# Patient Record
Sex: Female | Born: 1955 | Race: White | Hispanic: No | Marital: Married | Smoking: Never smoker
Health system: Southern US, Community
[De-identification: ages and names within clinical notes are randomized; demographics above are authoritative.]

## PROBLEM LIST (undated history)

## (undated) DIAGNOSIS — I10 Essential (primary) hypertension: Secondary | ICD-10-CM

## (undated) DIAGNOSIS — C801 Malignant (primary) neoplasm, unspecified: Secondary | ICD-10-CM

## (undated) HISTORY — PX: NASAL SINUS SURGERY: SHX719

---

## 2005-06-23 ENCOUNTER — Ambulatory Visit: Payer: Self-pay | Admitting: Otolaryngology

## 2014-11-17 ENCOUNTER — Encounter: Payer: Self-pay | Admitting: Gynecology

## 2014-11-17 ENCOUNTER — Ambulatory Visit
Admission: EM | Admit: 2014-11-17 | Discharge: 2014-11-17 | Disposition: A | Discharge status: Home / Self Care | Payer: 59 | Attending: Family Medicine | Admitting: Family Medicine

## 2014-11-17 DIAGNOSIS — J0100 Acute maxillary sinusitis, unspecified: Secondary | ICD-10-CM | POA: Insufficient documentation

## 2014-11-17 DIAGNOSIS — R0981 Nasal congestion: Secondary | ICD-10-CM | POA: Diagnosis present

## 2014-11-17 DIAGNOSIS — H66001 Acute suppurative otitis media without spontaneous rupture of ear drum, right ear: Secondary | ICD-10-CM | POA: Diagnosis not present

## 2014-11-17 DIAGNOSIS — J40 Bronchitis, not specified as acute or chronic: Secondary | ICD-10-CM | POA: Diagnosis not present

## 2014-11-17 DIAGNOSIS — J029 Acute pharyngitis, unspecified: Secondary | ICD-10-CM

## 2014-11-17 DIAGNOSIS — R05 Cough: Secondary | ICD-10-CM | POA: Diagnosis present

## 2014-11-17 LAB — RAPID STREP SCREEN (MED CTR MEBANE ONLY): Streptococcus, Group A Screen (Direct): NEGATIVE

## 2014-11-17 MED ORDER — FEXOFENADINE-PSEUDOEPHED ER 60-120 MG PO TB12: 1.0000 | ORAL_TABLET | Freq: Two times a day (BID) | ORAL | Status: DC

## 2014-11-17 MED ORDER — CEFUROXIME AXETIL 500 MG PO TABS: 500.0000 mg | ORAL_TABLET | Freq: Two times a day (BID) | ORAL | Status: DC

## 2014-11-17 MED ORDER — BENZONATATE 200 MG PO CAPS: 200.0000 mg | ORAL_CAPSULE | Freq: Three times a day (TID) | ORAL | Status: DC | PRN

## 2014-11-17 NOTE — Discharge Instructions (Signed)
Cough, Adult  A cough is a reflex that helps clear your throat and airways. It can help heal the body or may be a reaction to an irritated airway. A cough may only last 2 or 3 weeks (acute) or may last more than 8 weeks (chronic).  CAUSES Acute cough:  Viral or bacterial infections. Chronic cough:  Infections.  Allergies.  Asthma.  Post-nasal drip.  Smoking.  Heartburn or acid reflux.  Some medicines.  Chronic lung problems (COPD).  Cancer. SYMPTOMS   Cough.  Fever.  Chest pain.  Increased breathing rate.  High-pitched whistling sound when breathing (wheezing).  Colored mucus that you cough up (sputum). TREATMENT   A bacterial cough may be treated with antibiotic medicine.  A viral cough must run its course and will not respond to antibiotics.  Your caregiver may recommend other treatments if you have a chronic cough. HOME CARE INSTRUCTIONS   Only take over-the-counter or prescription medicines for pain, discomfort, or fever as directed by your caregiver. Use cough suppressants only as directed by your caregiver.  Use a cold steam vaporizer or humidifier in your bedroom or home to help loosen secretions.  Sleep in a semi-upright position if your cough is worse at night.  Rest as needed.  Stop smoking if you smoke. SEEK IMMEDIATE MEDICAL CARE IF:   You have pus in your sputum.  Your cough starts to worsen.  You cannot control your cough with suppressants and are losing sleep.  You begin coughing up blood.  You have difficulty breathing.  You develop pain which is getting worse or is uncontrolled with medicine.  You have a fever. MAKE SURE YOU:   Understand these instructions.  Will watch your condition.  Will get help right away if you are not doing well or get worse. Document Released: 12/18/2010 Document Revised: 09/13/2011 Document Reviewed: 12/18/2010 [facility] Patient Information 2015 [place], [place]. This information is not intended  to replace advice given to you by your health care provider. Make sure you discuss any questions you have with your health care provider. Otitis Media Otitis media is redness, soreness, and puffiness (swelling) in the space just behind your eardrum (middle ear). It may be caused by allergies or infection. It often happens along with a cold. HOME CARE  Take your medicine as told. Finish it even if you start to feel better.  Only take over-the-counter or prescription medicines for pain, discomfort, or fever as told by your doctor.  Follow up with your doctor as told. GET HELP IF:  You have otitis media only in one ear, or bleeding from your nose, or both.  You notice a lump on your neck.  You are not getting better in 3-5 days.  You feel worse instead of better. GET HELP RIGHT AWAY IF:   You have pain that is not helped with medicine.  You have puffiness, redness, or pain around your ear.  You get a stiff neck.  You cannot move part of your face (paralysis).  You notice that the bone behind your ear hurts when you touch it. MAKE SURE YOU:   Understand these instructions.  Will watch your condition.  Will get help right away if you are not doing well or get worse. Document Released: 12/08/2007 Document Revised: 06/26/2013 Document Reviewed: 01/16/2013 [facility] Patient Information 2015 [place], [place]. This information is not intended to replace advice given to you by your health care provider. Make sure you discuss any questions you have with your health care  provider. Sinusitis Sinusitis is redness, soreness, and inflammation of the paranasal sinuses. Paranasal sinuses are air pockets within the bones of your face (beneath the eyes, the middle of the forehead, or above the eyes). In healthy paranasal sinuses, mucus is able to drain out, and air is able to circulate through them by way of your nose. However, when your paranasal sinuses are inflamed, mucus and air can become  trapped. This can allow bacteria and other germs to grow and cause infection. Sinusitis can develop quickly and last only a short time (acute) or continue over a long period (chronic). Sinusitis that lasts for more than 12 weeks is considered chronic.  CAUSES  Causes of sinusitis include:  Allergies.  Structural abnormalities, such as displacement of the cartilage that separates your nostrils (deviated septum), which can decrease the air flow through your nose and sinuses and affect sinus drainage.  Functional abnormalities, such as when the small hairs (cilia) that line your sinuses and help remove mucus do not work properly or are not present. SIGNS AND SYMPTOMS  Symptoms of acute and chronic sinusitis are the same. The primary symptoms are pain and pressure around the affected sinuses. Other symptoms include:  Upper toothache.  Earache.  Headache.  Bad breath.  Decreased sense of smell and taste.  A cough, which worsens when you are lying flat.  Fatigue.  Fever.  Thick drainage from your nose, which often is green and may contain pus (purulent).  Swelling and warmth over the affected sinuses. DIAGNOSIS  Your health care provider will perform a physical exam. During the exam, your health care provider may:  Look in your nose for signs of abnormal growths in your nostrils (nasal polyps).  Tap over the affected sinus to check for signs of infection.  View the inside of your sinuses (endoscopy) using an imaging device that has a light attached (endoscope). If your health care provider suspects that you have chronic sinusitis, one or more of the following tests may be recommended:  Allergy tests.  Nasal culture. A sample of mucus is taken from your nose, sent to a lab, and screened for bacteria.  Nasal cytology. A sample of mucus is taken from your nose and examined by your health care provider to determine if your sinusitis is related to an allergy. TREATMENT  Most  cases of acute sinusitis are related to a viral infection and will resolve on their own within 10 days. Sometimes medicines are prescribed to help relieve symptoms (pain medicine, decongestants, nasal steroid sprays, or saline sprays).  However, for sinusitis related to a bacterial infection, your health care provider will prescribe antibiotic medicines. These are medicines that will help kill the bacteria causing the infection.  Rarely, sinusitis is caused by a fungal infection. In theses cases, your health care provider will prescribe antifungal medicine. For some cases of chronic sinusitis, surgery is needed. Generally, these are cases in which sinusitis recurs more than 3 times per year, despite other treatments. HOME CARE INSTRUCTIONS   Drink plenty of water. Water helps thin the mucus so your sinuses can drain more easily.  Use a humidifier.  Inhale steam 3 to 4 times a day (for example, sit in the bathroom with the shower running).  Apply a warm, moist washcloth to your face 3 to 4 times a day, or as directed by your health care provider.  Use saline nasal sprays to help moisten and clean your sinuses.  Take medicines only as directed by your health care  provider.  If you were prescribed either an antibiotic or antifungal medicine, finish it all even if you start to feel better. SEEK IMMEDIATE MEDICAL CARE IF:  You have increasing pain or severe headaches.  You have nausea, vomiting, or drowsiness.  You have swelling around your face.  You have vision problems.  You have a stiff neck.  You have difficulty breathing. MAKE SURE YOU:   Understand these instructions.  Will watch your condition.  Will get help right away if you are not doing well or get worse. Document Released: 06/21/2005 Document Revised: 11/05/2013 Document Reviewed: 07/06/2011 [facility] Patient Information 2015 [place], [place]. This information is not intended to replace advice given to you by your health  care provider. Make sure you discuss any questions you have with your health care provider.

## 2014-11-17 NOTE — ED Notes (Signed)
Patient c/o severe cough / congestion / sore throat/ thick greenish mucous x 5 days.

## 2014-11-17 NOTE — ED Provider Notes (Signed)
CSN: [id number]     Arrival date & time 11/17/14  [id number] History   First MD Initiated Contact with Patient 11/17/14 [id number]     Chief Complaint  Patient presents with  . Cough  . Sore Throat  . Nasal Congestion   (Consider location/radiation/quality/duration/timing/severity/associated sxs/prior Treatment) Patient is a 59 y.o. female presenting with cough and pharyngitis. The history is provided by the patient and the spouse. No language interpreter was used.  Cough Cough characteristics:  Productive, paroxysmal, nocturnal and hacking Sputum characteristics:  [place] (She states that this is coming from nasal drainage) Severity:  Moderate Onset quality: Started 5 days ago last Wednesday. Progression:  Worsening Chronicity:  New Smoker: no   Context: upper respiratory infection   Context: not animal exposure, not exposure to allergens, not fumes, not occupational exposure, not sick contacts, not smoke exposure, not weather changes and not with activity   Relieved by:  Nothing Worsened by:  Lying down Ineffective treatments:  Rest and cough suppressants Associated symptoms: sinus congestion and sore throat   Associated symptoms: no chest pain, no chills, no diaphoresis and no weight loss   Risk factors: no chemical exposure, no recent infection and no recent travel   Sore Throat Pertinent negatives include no chest pain.    History reviewed. No pertinent past medical history. Past Surgical History  Procedure Laterality Date  . Nasal sinus surgery     History reviewed. No pertinent family history. History  Substance Use Topics  . Smoking status: Never Smoker   . Smokeless tobacco: Not on file  . Alcohol Use: No   OB History    No data available     Review of Systems  Constitutional: Negative for chills, weight loss and diaphoresis.  HENT: Positive for sore throat.   Respiratory: Positive for cough.   Cardiovascular: Negative for chest pain.  All other systems reviewed and are  negative.   Allergies  Codeine and Sulfa antibiotics  Home Medications   Prior to Admission medications   Medication Sig Start Date End Date Taking? Authorizing Provider  benzonatate (TESSALON) 200 MG capsule Take 1 capsule (200 mg total) by mouth 3 (three) times daily as needed for cough. 11/17/14   Hassan RowanEugene Sheba Whaling, MD  cefUROXime (CEFTIN) 500 MG tablet Take 1 tablet (500 mg total) by mouth 2 (two) times daily. 11/17/14   Hassan RowanEugene Rether Rison, MD  fexofenadine-pseudoephedrine (ALLEGRA-D) 60-120 MG per tablet Take 1 tablet by mouth 2 (two) times daily. 11/17/14   Hassan RowanEugene Joyce Leckey, MD   BP 123/85 mmHg  Pulse 109  Temp(Src) 98.3 F (36.8 C) (Tympanic)  Ht 5\' 4"  (1.626 m)  Wt 162 lb (73.483 kg)  BMI 27.79 kg/m2  SpO2 100% Physical Exam  Constitutional: She is oriented to person, place, and time. She appears well-developed and well-nourished.  HENT:  Head: Normocephalic.  Right Ear: Tympanic membrane is erythematous and bulging.  Left Ear: Tympanic membrane is not erythematous and not bulging.  Nose: Mucosal edema and rhinorrhea present. Right sinus exhibits maxillary sinus tenderness. Left sinus exhibits maxillary sinus tenderness.  Mouth/Throat: Mucous membranes are normal. She does not have dentures. No oral lesions. No dental abscesses or dental caries. Posterior oropharyngeal erythema present.  Eyes: Pupils are equal, round, and reactive to light.  Neck: Neck supple. No tracheal deviation present.  Cardiovascular: Normal rate.   Pulmonary/Chest: Effort normal and breath sounds normal. No respiratory distress. She has no wheezes.  Musculoskeletal: Normal range of motion. She exhibits no edema.  Lymphadenopathy:  She has cervical adenopathy.  Neurological: She is alert and oriented to person, place, and time. No cranial nerve deficit.  Skin: Skin is warm.  Psychiatric: She has a normal mood and affect.    ED Course  Procedures (including critical care time) Labs Review Labs Reviewed  RAPID  STREP SCREEN  CULTURE, GROUP A STREP [facility])    Imaging Review No results found.   MDM   1. Acute maxillary sinusitis, recurrence not specified   2. Pharyngitis   3. Bronchitis   4. Acute suppurative otitis media of right ear without spontaneous rupture of tympanic membrane, recurrence not specified        Hassan RowanEugene Elizebath Wever, MD 11/17/14 1430

## 2014-11-19 LAB — CULTURE, GROUP A STREP (THRC)

## 2016-08-22 ENCOUNTER — Ambulatory Visit
Admission: EM | Admit: 2016-08-22 | Discharge: 2016-08-22 | Disposition: A | Discharge status: Home / Self Care | Payer: 59 | Attending: Family Medicine | Admitting: Family Medicine

## 2016-08-22 DIAGNOSIS — R42 Dizziness and giddiness: Secondary | ICD-10-CM | POA: Diagnosis not present

## 2016-08-22 DIAGNOSIS — R110 Nausea: Secondary | ICD-10-CM | POA: Diagnosis present

## 2016-08-22 DIAGNOSIS — A084 Viral intestinal infection, unspecified: Secondary | ICD-10-CM | POA: Insufficient documentation

## 2016-08-22 DIAGNOSIS — Z79899 Other long term (current) drug therapy: Secondary | ICD-10-CM | POA: Insufficient documentation

## 2016-08-22 LAB — CBC WITH DIFFERENTIAL/PLATELET
BASOS PCT: 1 %
Basophils Absolute: 0 10*3/uL (ref 0–0.1)
Eosinophils Absolute: 0 10*3/uL (ref 0–0.7)
Eosinophils Relative: 0 %
HCT: 43.9 % (ref 35.0–47.0)
Hemoglobin: 14.7 g/dL (ref 12.0–16.0)
LYMPHS PCT: 27 %
Lymphs Abs: 1.6 10*3/uL (ref 1.0–3.6)
MCH: 31.5 pg (ref 26.0–34.0)
MCHC: 33.5 g/dL (ref 32.0–36.0)
MCV: 94.2 fL (ref 80.0–100.0)
MONOS PCT: 7 %
Monocytes Absolute: 0.4 10*3/uL (ref 0.2–0.9)
NEUTROS ABS: 4 10*3/uL (ref 1.4–6.5)
Neutrophils Relative %: 65 %
PLATELETS: 308 10*3/uL (ref 150–440)
RBC: 4.66 MIL/uL (ref 3.80–5.20)
RDW: 12.8 % (ref 11.5–14.5)
WBC: 6.1 10*3/uL (ref 3.6–11.0)

## 2016-08-22 LAB — COMPREHENSIVE METABOLIC PANEL
ALT: 20 U/L (ref 14–54)
ANION GAP: 8 (ref 5–15)
AST: 23 U/L (ref 15–41)
Albumin: 4.6 g/dL (ref 3.5–5.0)
Alkaline Phosphatase: 48 U/L (ref 38–126)
BUN: 9 mg/dL (ref 6–20)
CO2: 26 mmol/L (ref 22–32)
Calcium: 9.2 mg/dL (ref 8.9–10.3)
Chloride: 102 mmol/L (ref 101–111)
Creatinine, Ser: 0.66 mg/dL (ref 0.44–1.00)
GFR calc Af Amer: >60 mL/min (ref >60)
GFR calc non Af Amer: >60 mL/min (ref >60)
Glucose, Bld: 104 mg/dL — ABNORMAL HIGH (ref 65–99)
Potassium: 4.1 mmol/L (ref 3.5–5.1)
Sodium: 136 mmol/L (ref 135–145)
TOTAL PROTEIN: 7.8 g/dL (ref 6.5–8.1)
Total Bilirubin: 0.6 mg/dL (ref 0.3–1.2)

## 2016-08-22 LAB — OCCULT BLOOD X 1 CARD TO LAB, STOOL: Fecal Occult Bld: NEGATIVE

## 2016-08-22 MED ORDER — ONDANSETRON 8 MG PO TBDP: 8.0000 mg | ORAL_TABLET | Freq: Three times a day (TID) | ORAL | 0 refills | Status: DC | PRN

## 2016-08-22 NOTE — ED Triage Notes (Signed)
Onset week was nauseous, diarrhea--- dark stool from past 3 days was dizzy weeks a go when pt wake up and has tingling feeling and has HA ongoing as per pt

## 2016-08-22 NOTE — ED Provider Notes (Addendum)
[facility]    CSN: [id number] Arrival date & time: 08/22/16  [id number]     History   Chief Complaint Chief Complaint  Patient presents with  . Nausea    HPI Wendy Schroeder is a 61 y.o. female.   61 yo female with a c/o nausea and loose dark stools for the past week. Patient also complains of intermittent dizziness for the past 3 days. Also complains of intermittent tingling all over her extremities. Denies any fevers, chills, chest pains, palpitations, shortness of breath. Patient's last visit to PCP was about 3 years ago and has never had a colonoscopy.    The history is provided by the patient.    History reviewed. No pertinent past medical history.  There are no active problems to display for this patient.   Past Surgical History:  Procedure Laterality Date  . NASAL SINUS SURGERY      OB History    No data available       Home Medications    Prior to Admission medications   Medication Sig Start Date End Date Taking? Authorizing Provider  benzonatate (TESSALON) 200 MG capsule Take 1 capsule (200 mg total) by mouth 3 (three) times daily as needed for cough. 11/17/14   Hassan RowanEugene Wade, MD  cefUROXime (CEFTIN) 500 MG tablet Take 1 tablet (500 mg total) by mouth 2 (two) times daily. 11/17/14   Hassan RowanEugene Wade, MD  fexofenadine-pseudoephedrine (ALLEGRA-D) 60-120 MG per tablet Take 1 tablet by mouth 2 (two) times daily. 11/17/14   Hassan RowanEugene Wade, MD  ondansetron (ZOFRAN ODT) 8 MG disintegrating tablet Take 1 tablet (8 mg total) by mouth every 8 (eight) hours as needed. 08/22/16   Payton Mccallumrlando Makynzee Tigges, MD    Family History No family history on file.  Social History Social History  Substance Use Topics  . Smoking status: Never Smoker  . Smokeless tobacco: Never Used  . Alcohol use No     Allergies   Codeine and Sulfa antibiotics   Review of Systems Review of Systems   Physical Exam Triage Vital Signs ED Triage Vitals  Enc Vitals Group     BP 08/22/16 0919 138/87     Pulse Rate 08/22/16 0919 85     Resp 08/22/16 0919 16     Temp 08/22/16 0919 98 F (36.7 C)     Temp Source 08/22/16 0919 Oral     SpO2 08/22/16 0919 100 %     Weight 08/22/16 0917 160 lb (72.6 kg)     Height 08/22/16 0917 5\' 6"  (1.676 m)     Head Circumference --      Peak Flow --      Pain Score 08/22/16 0918 7     Pain Loc --      Pain Edu? --      Excl. in GC? --    No data found.   Updated Vital Signs BP 138/87 (BP Location: Left Arm)   Pulse 85   Temp 98 F (36.7 C) (Oral)   Resp 16   Ht 5\' 6"  (1.676 m)   Wt 160 lb (72.6 kg)   SpO2 100%   BMI 25.82 kg/m   Visual Acuity Right Eye Distance:   Left Eye Distance:   Bilateral Distance:    Right Eye Near:   Left Eye Near:    Bilateral Near:     Physical Exam  Constitutional: She is oriented to person, place, and time. She appears well-developed and well-nourished. No distress.  HENT:  Head: Normocephalic.  Right Ear: Tympanic membrane, external ear and ear canal normal.  Left Ear: Tympanic membrane, external ear and ear canal normal.  Nose: Nose normal.  Mouth/Throat: Oropharynx is clear and moist and mucous membranes are normal. No oropharyngeal exudate.  Eyes: Conjunctivae and EOM are normal. Pupils are equal, round, and reactive to light. Right eye exhibits no discharge. Left eye exhibits no discharge. No scleral icterus.  Neck: Normal range of motion. Neck supple. No JVD present. No tracheal deviation present. No thyromegaly present.  Cardiovascular: Normal rate, regular rhythm, normal heart sounds and intact distal pulses.   No murmur heard. Pulmonary/Chest: Effort normal and breath sounds normal. No stridor. No respiratory distress. She has no wheezes. She has no rales. She exhibits no tenderness.  Abdominal: Soft. Bowel sounds are normal. She exhibits no distension and no mass. There is tenderness (mild, diffuse, left and right lower quadrant). There is no rebound and no guarding.  Musculoskeletal: She  exhibits no edema or tenderness.  Lymphadenopathy:    She has no cervical adenopathy.  Neurological: She is alert and oriented to person, place, and time. She has normal reflexes. No cranial nerve deficit.  Skin: Skin is warm and dry. No rash noted. She is not diaphoretic. No erythema. No pallor.  Psychiatric: She has a normal mood and affect. Her behavior is normal. Judgment and thought content normal.  Nursing note and vitals reviewed.    UC Treatments / Results  Labs (all labs ordered are listed, but only abnormal results are displayed) Labs Reviewed  COMPREHENSIVE METABOLIC PANEL - Abnormal; Notable for the following:       Result Value   Glucose, Bld 104 (*)    All other components within normal limits  CBC WITH DIFFERENTIAL/PLATELET  OCCULT BLOOD X 1 CARD TO LAB, STOOL    EKG  EKG Interpretation None       Radiology No results found.  Procedures ED EKG Date/Time: 08/22/2016 10:54 AM Performed by: Payton Mccallum Authorized by: Payton Mccallum   ECG reviewed by ED Physician in the absence of a cardiologist: yes   Previous ECG:    Previous ECG:  Unavailable Interpretation:    Interpretation: normal   Rate:    ECG rate assessment: normal   Rhythm:    Rhythm: sinus rhythm   Ectopy:    Ectopy: none   QRS:    QRS axis:  Normal Conduction:    Conduction: normal   ST segments:    ST segments:  Normal T waves:    T waves: normal      (including critical care time)  Medications Ordered in UC Medications - No data to display   Initial Impression / Assessment and Plan / UC Course  I have reviewed the triage vital signs and the nursing notes.  Pertinent labs & imaging results that were available during my care of the patient were reviewed by me and considered in my medical decision making (see chart for details).       Final Clinical Impressions(s) / UC Diagnoses   Final diagnoses:  Viral gastroenteritis  Dizziness    New Prescriptions Discharge  Medication List as of 08/22/2016 10:51 AM    START taking these medications   Details  ondansetron (ZOFRAN ODT) 8 MG disintegrating tablet Take 1 tablet (8 mg total) by mouth every 8 (eight) hours as needed., Starting Sun 08/22/2016, Normal       1.Lab results and diagnosis reviewed with patient 2. rx as  per orders above; reviewed possible side effects, interactions, risks and benefits  3. Recommend supportive treatment with increased fluids, otc Imodium AD prn 4. Recommend follow up with PCP  5. Follow-up prn if symptoms worsen or don't improve   Payton Mccallum, MD 08/22/16 1057    Payton Mccallum, MD 08/22/16 1058

## 2016-08-22 NOTE — Discharge Instructions (Signed)
Imodium AD over the counter as needed for loose stools Increase fluids/liquids, bland diet then advance slowly to regular diet

## 2018-12-27 ENCOUNTER — Other Ambulatory Visit: Payer: Self-pay

## 2018-12-27 ENCOUNTER — Ambulatory Visit
Admission: EM | Admit: 2018-12-27 | Discharge: 2018-12-27 | Disposition: A | Discharge status: Home / Self Care | Payer: 59 | Attending: Family Medicine | Admitting: Family Medicine

## 2018-12-27 ENCOUNTER — Encounter: Payer: Self-pay | Admitting: Emergency Medicine

## 2018-12-27 DIAGNOSIS — R202 Paresthesia of skin: Secondary | ICD-10-CM | POA: Diagnosis not present

## 2018-12-27 DIAGNOSIS — R0789 Other chest pain: Secondary | ICD-10-CM | POA: Diagnosis not present

## 2018-12-27 LAB — COMPREHENSIVE METABOLIC PANEL
ALT: 13 U/L (ref 0–44)
AST: 18 U/L (ref 15–41)
Albumin: 4.6 g/dL (ref 3.5–5.0)
Alkaline Phosphatase: 84 U/L (ref 38–126)
Anion gap: 10 (ref 5–15)
BUN: 12 mg/dL (ref 8–23)
CO2: 24 mmol/L (ref 22–32)
Calcium: 9.1 mg/dL (ref 8.9–10.3)
Chloride: 105 mmol/L (ref 98–111)
Creatinine, Ser: 0.68 mg/dL (ref 0.44–1.00)
GFR calc Af Amer: >60 mL/min (ref >60)
GFR calc non Af Amer: >60 mL/min (ref >60)
Glucose, Bld: 110 mg/dL — ABNORMAL HIGH (ref 70–99)
Potassium: 4.1 mmol/L (ref 3.5–5.1)
Sodium: 139 mmol/L (ref 135–145)
Total Bilirubin: 0.7 mg/dL (ref 0.3–1.2)
Total Protein: 7.6 g/dL (ref 6.5–8.1)

## 2018-12-27 LAB — CBC WITH DIFFERENTIAL/PLATELET
Abs Immature Granulocytes: 0.01 10*3/uL (ref 0.00–0.07)
Basophils Absolute: 0 10*3/uL (ref 0.0–0.1)
Basophils Relative: 1 %
Eosinophils Absolute: 0 10*3/uL (ref 0.0–0.5)
Eosinophils Relative: 0 %
HCT: 42.7 % (ref 36.0–46.0)
Hemoglobin: 14.7 g/dL (ref 12.0–15.0)
Immature Granulocytes: 0 %
Lymphocytes Relative: 27 %
Lymphs Abs: 1.5 10*3/uL (ref 0.7–4.0)
MCH: 32.4 pg (ref 26.0–34.0)
MCHC: 34.4 g/dL (ref 30.0–36.0)
MCV: 94.1 fL (ref 80.0–100.0)
Monocytes Absolute: 0.4 10*3/uL (ref 0.1–1.0)
Monocytes Relative: 6 %
Neutro Abs: 3.6 10*3/uL (ref 1.7–7.7)
Neutrophils Relative %: 66 %
Platelets: 273 10*3/uL (ref 150–400)
RBC: 4.54 MIL/uL (ref 3.87–5.11)
RDW: 12.4 % (ref 11.5–15.5)
WBC: 5.5 10*3/uL (ref 4.0–10.5)
nRBC: 0 % (ref 0.0–0.2)

## 2018-12-27 MED ORDER — HYDROXYZINE HCL 25 MG PO TABS: 25.0000 mg | ORAL_TABLET | Freq: Three times a day (TID) | ORAL | 0 refills | Status: AC | PRN

## 2018-12-27 NOTE — ED Triage Notes (Signed)
Patient states for the past several days she has been jittery and felt a little nauseated and like her bp is elevated

## 2018-12-27 NOTE — Discharge Instructions (Signed)
Labs and EKG unremarkable.  Suspect anxiety.  Medication as directed

## 2018-12-27 NOTE — ED Provider Notes (Signed)
[facility]    CSN: [id number] Arrival date & time: 12/27/18  0850  History   Chief Complaint Chief Complaint  Patient presents with  . Hypertension    HPI  63 year old female presents with complaints of skin tingling, nausea, and chest pressure.  Patient reports for the past 4 to 5 days she has had the above symptoms.  She reports that her skin feels tingly.  She states that she feels slightly nervous and anxious.  She reports mild nausea.  She reports chest pressure.  No shortness of breath.  No fever.  Patient states that she is unaware of any stress that she has currently.  She does feel anxious and nervous.  She reports that her chest pressure and tightness is 7/10 in severity.  Intermittent.  No exacerbating relieving factors.  No other reported symptoms.  No other complaints.  PMH, Surgical Hx, Family Hx, Social History reviewed and updated as below.  PMH: Hx of sinusitis  Past Surgical History:  Procedure Laterality Date  . NASAL SINUS SURGERY      OB History   No obstetric history on file.    Home Medications    Prior to Admission medications   Medication Sig Start Date End Date Taking? Authorizing Provider  hydrOXYzine (ATARAX/VISTARIL) 25 MG tablet Take 1 tablet (25 mg total) by mouth every 8 (eight) hours as needed. 12/27/18   Coral Spikes, DO    Family History Family History  Problem Relation Age of Onset  . Gallbladder disease Father   . Gallbladder disease Brother     Social History Social History   Tobacco Use  . Smoking status: Never Smoker  . Smokeless tobacco: Never Used  Substance Use Topics  . Alcohol use: No  . Drug use: No     Allergies   Codeine and Sulfa antibiotics   Review of Systems Review of Systems  Respiratory: Positive for chest tightness.   Gastrointestinal: Positive for nausea.  Psychiatric/Behavioral: The patient is nervous/anxious.    Physical Exam Triage Vital Signs ED Triage Vitals  Enc Vitals  Group     BP 12/27/18 0907 (!) 144/88     Pulse Rate 12/27/18 0907 (!) 101     Resp 12/27/18 0907 18     Temp 12/27/18 0907 98.4 F (36.9 C)     Temp src --      SpO2 12/27/18 0907 99 %     Weight 12/27/18 0910 154 lb (69.9 kg)     Height 12/27/18 0910 5\' 6"  (1.676 m)     Head Circumference --      Peak Flow --      Pain Score 12/27/18 0909 7     Pain Loc --      Pain Edu? --      Excl. in [place]? --    Updated Vital Signs BP (!) 144/88 (BP Location: Left Arm)   Pulse (!) 101   Temp 98.4 F (36.9 C)   Resp 18   Ht 5\' 6"  (1.676 m)   Wt 69.9 kg   SpO2 99%   BMI 24.86 kg/m   Visual Acuity Right Eye Distance:   Left Eye Distance:   Bilateral Distance:    Right Eye Near:   Left Eye Near:    Bilateral Near:     Physical Exam Vitals signs and nursing note reviewed.  Constitutional:      General: She is not in acute distress.    Appearance: Normal appearance.  HENT:     Head: Normocephalic and atraumatic.  Eyes:     General:        Right eye: No discharge.        Left eye: No discharge.     Conjunctiva/sclera: Conjunctivae normal.  Neck:     Musculoskeletal: Neck supple.  Cardiovascular:     Rate and Rhythm: Normal rate and regular rhythm.  Pulmonary:     Effort: Pulmonary effort is normal.     Breath sounds: Normal breath sounds. No wheezing, rhonchi or rales.  Chest:     Chest wall: No tenderness.  Abdominal:     General: There is no distension.     Palpations: Abdomen is soft.     Tenderness: There is no abdominal tenderness.  Neurological:     Mental Status: She is alert.  Psychiatric:        Behavior: Behavior normal.     Comments: Anxious.    UC Treatments / Results  Labs (all labs ordered are listed, but only abnormal results are displayed) Labs Reviewed  COMPREHENSIVE METABOLIC PANEL - Abnormal; Notable for the following components:      Result Value   Glucose, Bld 110 (*)    All other components within normal limits  CBC WITH  DIFFERENTIAL/PLATELET    EKG Interpretation: Normal sinus rhythm with a rate of 79.  Normal axis.  Probable LAE.  No discrete ST or T wave changes.  Unremarkable EKG.  Radiology No results found.  Procedures Procedures (including critical care time)  Medications Ordered in UC Medications - No data to display  Initial Impression / Assessment and Plan / UC Course  I have reviewed the triage vital signs and the nursing notes.  Pertinent labs & imaging results that were available during my care of the patient were reviewed by me and considered in my medical decision making (see chart for details).    63 year old female presents with suspected anxiety.  Her labs and EKG were essentially unremarkable.  Atarax as needed.  Supportive care.  Final Clinical Impressions(s) / UC Diagnoses   Final diagnoses:  Chest pressure  Tingling     Discharge Instructions     Labs and EKG unremarkable.  Suspect anxiety.  Medication as directed   ED Prescriptions    Medication Sig Dispense Auth. Provider   hydrOXYzine (ATARAX/VISTARIL) 25 MG tablet Take 1 tablet (25 mg total) by mouth every 8 (eight) hours as needed. 30 tablet Tommie Samsook, Ramirez Fullbright G, DO     Controlled Substance Prescriptions [place] Controlled Substance Registry consulted? Not Applicable   Tommie SamsCook, Ladye Macnaughton G, DO 12/27/18 1053

## 2019-01-03 DIAGNOSIS — Z7689 Persons encountering health services in other specified circumstances: Secondary | ICD-10-CM | POA: Insufficient documentation

## 2019-01-03 DIAGNOSIS — R030 Elevated blood-pressure reading, without diagnosis of hypertension: Secondary | ICD-10-CM | POA: Insufficient documentation

## 2019-01-03 DIAGNOSIS — F411 Generalized anxiety disorder: Secondary | ICD-10-CM | POA: Insufficient documentation

## 2019-01-10 DIAGNOSIS — G43009 Migraine without aura, not intractable, without status migrainosus: Secondary | ICD-10-CM | POA: Insufficient documentation

## 2019-01-24 ENCOUNTER — Other Ambulatory Visit: Payer: Self-pay | Admitting: Gerontology

## 2019-01-24 DIAGNOSIS — R110 Nausea: Secondary | ICD-10-CM | POA: Insufficient documentation

## 2019-01-24 DIAGNOSIS — R002 Palpitations: Secondary | ICD-10-CM | POA: Insufficient documentation

## 2019-01-24 DIAGNOSIS — R030 Elevated blood-pressure reading, without diagnosis of hypertension: Secondary | ICD-10-CM

## 2019-01-28 ENCOUNTER — Emergency Department
Admission: EM | Admit: 2019-01-28 | Discharge: 2019-01-28 | Disposition: A | Discharge status: Home / Self Care | Payer: 59 | Attending: Emergency Medicine | Admitting: Emergency Medicine

## 2019-01-28 ENCOUNTER — Other Ambulatory Visit: Payer: Self-pay

## 2019-01-28 ENCOUNTER — Encounter: Payer: Self-pay | Admitting: Emergency Medicine

## 2019-01-28 ENCOUNTER — Emergency Department: Payer: 59

## 2019-01-28 DIAGNOSIS — Z20828 Contact with and (suspected) exposure to other viral communicable diseases: Secondary | ICD-10-CM | POA: Insufficient documentation

## 2019-01-28 DIAGNOSIS — R232 Flushing: Secondary | ICD-10-CM | POA: Diagnosis not present

## 2019-01-28 DIAGNOSIS — E876 Hypokalemia: Secondary | ICD-10-CM | POA: Diagnosis not present

## 2019-01-28 DIAGNOSIS — R002 Palpitations: Secondary | ICD-10-CM | POA: Diagnosis present

## 2019-01-28 DIAGNOSIS — I10 Essential (primary) hypertension: Secondary | ICD-10-CM | POA: Diagnosis not present

## 2019-01-28 HISTORY — DX: Essential (primary) hypertension: I10

## 2019-01-28 LAB — COMPREHENSIVE METABOLIC PANEL
ALT: 15 U/L (ref 0–44)
AST: 17 U/L (ref 15–41)
Albumin: 4.7 g/dL (ref 3.5–5.0)
Alkaline Phosphatase: 51 U/L (ref 38–126)
Anion gap: 9 (ref 5–15)
BUN: 14 mg/dL (ref 8–23)
CO2: 24 mmol/L (ref 22–32)
Calcium: 9 mg/dL (ref 8.9–10.3)
Chloride: 105 mmol/L (ref 98–111)
Creatinine, Ser: 0.71 mg/dL (ref 0.44–1.00)
GFR calc Af Amer: >60 mL/min (ref >60)
GFR calc non Af Amer: >60 mL/min (ref >60)
Glucose, Bld: 115 mg/dL — ABNORMAL HIGH (ref 70–99)
Potassium: 3.2 mmol/L — ABNORMAL LOW (ref 3.5–5.1)
Sodium: 138 mmol/L (ref 135–145)
Total Bilirubin: 0.6 mg/dL (ref 0.3–1.2)
Total Protein: 7.6 g/dL (ref 6.5–8.1)

## 2019-01-28 LAB — CBC WITH DIFFERENTIAL/PLATELET
Abs Immature Granulocytes: 0.01 10*3/uL (ref 0.00–0.07)
Basophils Absolute: 0 10*3/uL (ref 0.0–0.1)
Basophils Relative: 0 %
Eosinophils Absolute: 0.1 10*3/uL (ref 0.0–0.5)
Eosinophils Relative: 2 %
HCT: 43.1 % (ref 36.0–46.0)
Hemoglobin: 14.3 g/dL (ref 12.0–15.0)
Immature Granulocytes: 0 %
Lymphocytes Relative: 29 %
Lymphs Abs: 2.1 10*3/uL (ref 0.7–4.0)
MCH: 31.6 pg (ref 26.0–34.0)
MCHC: 33.2 g/dL (ref 30.0–36.0)
MCV: 95.1 fL (ref 80.0–100.0)
Monocytes Absolute: 0.4 10*3/uL (ref 0.1–1.0)
Monocytes Relative: 6 %
Neutro Abs: 4.4 10*3/uL (ref 1.7–7.7)
Neutrophils Relative %: 63 %
Platelets: 268 10*3/uL (ref 150–400)
RBC: 4.53 MIL/uL (ref 3.87–5.11)
RDW: 12.5 % (ref 11.5–15.5)
WBC: 7 10*3/uL (ref 4.0–10.5)
nRBC: 0 % (ref 0.0–0.2)

## 2019-01-28 LAB — MAGNESIUM: Magnesium: 2.2 mg/dL (ref 1.7–2.4)

## 2019-01-28 MED ORDER — POTASSIUM CHLORIDE CRYS ER 20 MEQ PO TBCR
40.0000 meq | EXTENDED_RELEASE_TABLET | Freq: Once | ORAL | Status: AC
  Administered 2019-01-28: 07:00:00 40 meq via ORAL
  Filled 2019-01-28: qty 2

## 2019-01-28 MED ORDER — LORAZEPAM 1 MG PO TABS
1.0000 mg | ORAL_TABLET | Freq: Once | ORAL | Status: AC
  Administered 2019-01-28: 1 mg via ORAL
  Filled 2019-01-28: qty 1

## 2019-01-28 NOTE — Discharge Instructions (Addendum)
For now, I would recommend stopping the venlafaxine.  Short-term, this can increase anxiety as well as increase her blood pressure.  Since you have only been taking this for several days only once a day, it is safe to stop this now.  Drink plenty of fluids, at least 8 glasses of water over the rest of the weekend.  Call your doctor on Monday.  I would recommend discussing possible outpatient cardiac monitoring for possible arrhythmia, or abnormal heart rhythm.  Fortunately, your labs were otherwise reassuring today.  Do not exert yourself or lift anything greater than 10 pounds until cleared by your primary doctor.

## 2019-01-28 NOTE — ED Notes (Signed)
Patient to waiting room via wheelchair by EMS.  Per EMS patient complains of hot flashes for a week, EMS reports patient took her anti-anxiety medication at 6:30 pm for these symptoms.  Vital signs wnl per EMS.

## 2019-01-28 NOTE — ED Notes (Signed)
Pt reports over the last few weeks she has had pressure in her head, hears a dripping sound in her left ear, sinus congestion, occasional cough and occasional slight shortness of breath, chills, abd pain, sore throat and weakness. Reports she started seeing a new doctor and was prescribed new medications last week. Reports she has not taken all of them as she does not like taking medications. Reports last night she woke up after sleeping a short period of time and her body felt hot from her head to her toes. Respirations even and unlabored. No distress noted.

## 2019-01-28 NOTE — ED Notes (Signed)
Patient given a warm blanket at this time.  

## 2019-01-28 NOTE — ED Provider Notes (Signed)
[facility] Emergency Department Provider Note  ____________________________________________   First MD Initiated Contact with Patient 01/28/19 [phone]     (approximate)  I have reviewed the triage vital signs and the nursing notes.   HISTORY  Chief Complaint Hot Flashes    HPI Wendy AguasLynn L Schroeder is a 63 y.o. female  Here with sensation of palpitations, flushing, and generalized weakness. Pt states that intermittently for months, she's had episodes of nausea, epigastric pain, palpitations, and sensation of "flushing" in her entire body. She has some occasional sensation of anxiety as well. She has seen her PCP for this and had repeat labs that are all unremakrble, and was diagnosed w/ suspected anxiety though she has a RUQ U/S ordered for her n/v as well. Tonight, she states she woke up and sx were worse than usual so she came to the ED. Denies any overt chest pain. No recent stressors. No SOB, diaphoresis. No other complaints.        Past Medical History:  Diagnosis Date   Hypertension     There are no active problems to display for this patient.   Past Surgical History:  Procedure Laterality Date   NASAL SINUS SURGERY      Prior to Admission medications   Medication Sig Start Date End Date Taking? Authorizing Provider  hydrOXYzine (ATARAX/VISTARIL) 25 MG tablet Take 1 tablet (25 mg total) by mouth every 8 (eight) hours as needed. 12/27/18   Tommie Samsook, Jayce G, DO    Allergies Codeine and Sulfa antibiotics  Family History  Problem Relation Age of Onset   Gallbladder disease Father    Gallbladder disease Brother     Social History Social History   Tobacco Use   Smoking status: Never Smoker   Smokeless tobacco: Never Used  Substance Use Topics   Alcohol use: No   Drug use: No    Review of Systems  Review of Systems  Constitutional: Positive for fatigue. Negative for fever.  HENT: Negative for congestion and sore throat.   Eyes:  Negative for visual disturbance.  Respiratory: Negative for cough and shortness of breath.   Cardiovascular: Positive for palpitations. Negative for chest pain.  Gastrointestinal: Positive for nausea. Negative for abdominal pain, diarrhea and vomiting.  Genitourinary: Negative for flank pain.  Musculoskeletal: Negative for back pain and neck pain.  Skin: Negative for rash and wound.  Neurological: Positive for weakness.  Psychiatric/Behavioral: The patient is nervous/anxious.      ____________________________________________  PHYSICAL EXAM:      VITAL SIGNS: ED Triage Vitals  Enc Vitals Group     BP 01/28/19 0034 (!) 154/92     Pulse Rate 01/28/19 0034 90     Resp 01/28/19 0034 18     Temp 01/28/19 0034 98.9 F (37.2 C)     Temp Source 01/28/19 0034 Oral     SpO2 01/28/19 0034 98 %     Weight 01/28/19 0036 151 lb (68.5 kg)     Height 01/28/19 0036 5\' 5"  (1.651 m)     Head Circumference --      Peak Flow --      Pain Score 01/28/19 0035 5     Pain Loc --      Pain Edu? --      Excl. in [place]? --      Physical Exam Vitals signs and nursing note reviewed.  Constitutional:      General: She is not in acute distress.    Appearance: She  is well-developed.  HENT:     Head: Normocephalic and atraumatic.  Eyes:     Conjunctiva/sclera: Conjunctivae normal.  Neck:     Musculoskeletal: Neck supple.     Comments: No carotid bruit bilaterally Cardiovascular:     Rate and Rhythm: Normal rate and regular rhythm.     Heart sounds: Normal heart sounds. No murmur. No friction rub.  Pulmonary:     Effort: Pulmonary effort is normal. No respiratory distress.     Breath sounds: Normal breath sounds. No wheezing or rales.  Abdominal:     General: There is no distension.     Palpations: Abdomen is soft.     Tenderness: There is no abdominal tenderness.  Skin:    General: Skin is warm.     Capillary Refill: Capillary refill takes less than 2 seconds.  Neurological:     Mental  Status: She is alert and oriented to person, place, and time.     Motor: No abnormal muscle tone.       ____________________________________________   LABS (all labs ordered are listed, but only abnormal results are displayed)  Labs Reviewed  COMPREHENSIVE METABOLIC PANEL - Abnormal; Notable for the following components:      Result Value   Potassium 3.2 (*)    Glucose, Bld 115 (*)    All other components within normal limits  NOVEL CORONAVIRUS, NAA (HOSPITAL ORDER, SEND-OUT TO REF LAB)  CBC WITH DIFFERENTIAL/PLATELET  MAGNESIUM    ____________________________________________  EKG: Sinus rhythm. PR shortened, but intervals o/w normal. No delta waves. No ischemic changes. No signs of WPW, HOCM, or long QTc. ________________________________________  RADIOLOGY All imaging, including plain films, CT scans, and ultrasounds, independently reviewed by me, and interpretations confirmed via formal radiology reads.  ED MD interpretation:   CXR: Clear  Official radiology report(s): Dg Chest 2 View  Result Date: 01/28/2019 CLINICAL DATA:  Palpitations EXAM: CHEST - 2 VIEW COMPARISON:  None. FINDINGS: Normal heart size and mediastinal contours. No acute infiltrate or edema. No effusion or pneumothorax. No acute osseous findings. Lumbar dextroscoliosis. IMPRESSION: No evidence of acute cardiopulmonary disease. Electronically Signed   By: Marnee SpringJonathon  Watts M.D.   On: 01/28/2019 07:17    ____________________________________________  PROCEDURES   Procedure(s) performed (including Critical Care):  Procedures  ____________________________________________  INITIAL IMPRESSION / MDM / ASSESSMENT AND PLAN / ED COURSE  As part of my medical decision making, I reviewed the following data within the electronic MEDICAL RECORD NUMBER Notes from prior ED visits and  Controlled Substance Database      *Wendy AguasLynn L Benn was evaluated in Emergency Department on 01/28/2019 for the symptoms described in  the history of present illness. She was evaluated in the context of the global COVID-19 pandemic, which necessitated consideration that the patient might be at risk for infection with the SARS-CoV-2 virus that causes COVID-19. Institutional protocols and algorithms that pertain to the evaluation of patients at risk for COVID-19 are in a state of rapid change based on information released by regulatory bodies including the CDC and federal and state organizations. These policies and algorithms were followed during the patient's care in the ED.  Some ED evaluations and interventions may be delayed as a result of limited staffing during the pandemic.*      Medical Decision Making: 63 yo F here with intermittent episodes of anxiety, nausea, palpitations, and flushed sensation. No overt chest pain. EKG does show short PR but no delta waves or signs of WPW. No family  h/o SCD or arrhythmia. Labs today show mild hypokalemia but are o/w unremarkable. K replaced. DDx is broad. Primary concern is transient arrhythmia though must also consider anxiety. Less likely GB disease given normal LFTs, no abd TTP. No focal neuro deficits. No signs of ischemia on EKG. WIll advise to f/u with PCP for possible outpt Holter monitoring, but given stable vitals, well appearance, reassuring labs with sx lasting several months, doubt acute emergent pathology.  ____________________________________________  FINAL CLINICAL IMPRESSION(S) / ED DIAGNOSES  Final diagnoses:  Hypokalemia  Flushing  Essential hypertension     MEDICATIONS GIVEN DURING THIS VISIT:  Medications  LORazepam (ATIVAN) tablet 1 mg (1 mg Oral Given 01/28/19 1191)  potassium chloride SA (K-DUR) CR tablet 40 mEq (40 mEq Oral Given 01/28/19 4782)     ED Discharge Orders    None       Note:  This document was prepared using Dragon voice recognition software and may include unintentional dictation errors.   Duffy Bruce, MD 01/28/19 [phone]

## 2019-01-28 NOTE — ED Triage Notes (Addendum)
Patient brought in from home by ems. Patient states that she was sleeping and was awaken from her sleep with an intermittent feeling hot all over. Patient states that she was seen by her pcp on Wednesday for headache and a dripping sound in left ear. Patient states that her pcp told her that she thought that it was anxiety and started her anxiety medications. Patient states that on Thursday morning she checked her bp and it was 150/100 and that her pcp started her on bp medications.

## 2019-01-28 NOTE — ED Notes (Signed)
Pt returned from xray

## 2019-01-28 NOTE — ED Notes (Signed)
Patient transported to X-ray 

## 2019-01-30 ENCOUNTER — Other Ambulatory Visit: Payer: Self-pay

## 2019-01-30 ENCOUNTER — Encounter (INDEPENDENT_AMBULATORY_CARE_PROVIDER_SITE_OTHER): Payer: Self-pay

## 2019-01-30 ENCOUNTER — Ambulatory Visit
Admission: RE | Admit: 2019-01-30 | Discharge: 2019-01-30 | Disposition: A | Discharge status: Home / Self Care | Payer: 59 | Source: Ambulatory Visit | Attending: Gerontology | Admitting: Gerontology

## 2019-01-30 DIAGNOSIS — R030 Elevated blood-pressure reading, without diagnosis of hypertension: Secondary | ICD-10-CM | POA: Diagnosis present

## 2019-01-30 DIAGNOSIS — R110 Nausea: Secondary | ICD-10-CM | POA: Insufficient documentation

## 2019-01-30 DIAGNOSIS — R002 Palpitations: Secondary | ICD-10-CM | POA: Diagnosis present

## 2019-01-30 LAB — NOVEL CORONAVIRUS, NAA (HOSP ORDER, SEND-OUT TO REF LAB; TAT 18-24 HRS): SARS-CoV-2, NAA: NOT DETECTED

## 2019-02-05 DIAGNOSIS — I10 Essential (primary) hypertension: Secondary | ICD-10-CM | POA: Insufficient documentation

## 2019-02-05 DIAGNOSIS — E782 Mixed hyperlipidemia: Secondary | ICD-10-CM | POA: Insufficient documentation

## 2019-02-05 DIAGNOSIS — R0602 Shortness of breath: Secondary | ICD-10-CM | POA: Insufficient documentation

## 2019-04-24 ENCOUNTER — Other Ambulatory Visit: Payer: Self-pay

## 2020-01-23 ENCOUNTER — Encounter: Payer: Self-pay | Admitting: Emergency Medicine

## 2020-01-23 ENCOUNTER — Other Ambulatory Visit: Payer: Self-pay

## 2020-01-23 ENCOUNTER — Emergency Department
Admission: EM | Admit: 2020-01-23 | Discharge: 2020-01-23 | Disposition: A | Discharge status: Home / Self Care | Payer: 59 | Attending: Emergency Medicine | Admitting: Emergency Medicine

## 2020-01-23 ENCOUNTER — Emergency Department: Payer: 59

## 2020-01-23 DIAGNOSIS — I10 Essential (primary) hypertension: Secondary | ICD-10-CM | POA: Diagnosis present

## 2020-01-23 LAB — CBC
HCT: 41.1 % (ref 36.0–46.0)
Hemoglobin: 13.9 g/dL (ref 12.0–15.0)
MCH: 31.7 pg (ref 26.0–34.0)
MCHC: 33.8 g/dL (ref 30.0–36.0)
MCV: 93.6 fL (ref 80.0–100.0)
Platelets: 274 10*3/uL (ref 150–400)
RBC: 4.39 MIL/uL (ref 3.87–5.11)
RDW: 12.6 % (ref 11.5–15.5)
WBC: 6.9 10*3/uL (ref 4.0–10.5)
nRBC: 0 % (ref 0.0–0.2)

## 2020-01-23 LAB — BASIC METABOLIC PANEL
Anion gap: 8 (ref 5–15)
BUN: 13 mg/dL (ref 8–23)
CO2: 24 mmol/L (ref 22–32)
Calcium: 9.3 mg/dL (ref 8.9–10.3)
Chloride: 106 mmol/L (ref 98–111)
Creatinine, Ser: 0.89 mg/dL (ref 0.44–1.00)
GFR calc Af Amer: >60 mL/min (ref >60)
GFR calc non Af Amer: >60 mL/min (ref >60)
Glucose, Bld: 130 mg/dL — ABNORMAL HIGH (ref 70–99)
Potassium: 4 mmol/L (ref 3.5–5.1)
Sodium: 138 mmol/L (ref 135–145)

## 2020-01-23 LAB — TROPONIN I (HIGH SENSITIVITY)
Troponin I (High Sensitivity): 4 ng/L (ref <18)
Troponin I (High Sensitivity): 5 ng/L (ref <18)

## 2020-01-23 MED ORDER — AMLODIPINE BESYLATE 5 MG PO TABS: 5.0000 mg | ORAL_TABLET | Freq: Every day | ORAL | 2 refills | Status: DC

## 2020-01-23 NOTE — ED Notes (Signed)
E-signature not working at this time. Pt verbalized understanding of D/C instructions, prescriptions and follow up care with no further questions at this time. Pt in NAD and ambulatory at time of D/C.  

## 2020-01-23 NOTE — ED Provider Notes (Signed)
[facility] Emergency Department Provider Note   ____________________________________________   I have reviewed the triage vital signs and the nursing notes.   HISTORY  Chief Complaint Tachycardia   History limited by: Not Limited   HPI Wendy Schroeder is a 64 y.o. female who presents to the emergency department today because of concerns for an episode of high blood pressure, tachycardia and tingling.  Patient states her blood pressure has been high over the past few days.  Today she noticed that it was higher than her baseline again.  She was accompanied by the sensation of tingling in her extremities.  When she checked her heart rate it was elevated into the 120s.  The time my exam heart rate has improved although she continues to feel the tingling sensation.  She says that she felt similar sensation when she was first diagnosed with high blood pressure and that it went away around the time she was started on blood pressure medications.   Records reviewed. Per medical record review patient has a history of HTN  Past Medical History:  Diagnosis Date   Hypertension     There are no problems to display for this patient.   Past Surgical History:  Procedure Laterality Date   NASAL SINUS SURGERY      Prior to Admission medications   Medication Sig Start Date End Date Taking? Authorizing Provider  hydrOXYzine (ATARAX/VISTARIL) 25 MG tablet Take 1 tablet (25 mg total) by mouth every 8 (eight) hours as needed. 12/27/18   Tommie Sams, DO    Allergies Codeine and Sulfa antibiotics  Family History  Problem Relation Age of Onset   Gallbladder disease Father    Gallbladder disease Brother     Social History Social History   Tobacco Use   Smoking status: Never Smoker   Smokeless tobacco: Never Used  Substance Use Topics   Alcohol use: No   Drug use: No    Review of Systems Constitutional: No fever/chills Eyes: No visual changes. ENT: No  sore throat. Cardiovascular: Positive for chest tightness. Respiratory: Denies shortness of breath. Gastrointestinal: No abdominal pain.  No nausea, no vomiting.  No diarrhea.   Genitourinary: Negative for dysuria. Musculoskeletal: Negative for back pain. Skin: Negative for rash. Neurological: Positive for tingling to extremities.  ____________________________________________   PHYSICAL EXAM:  VITAL SIGNS: ED Triage Vitals  Enc Vitals Group     BP 01/23/20 1414 (!) 115/96     Pulse Rate 01/23/20 1414 84     Resp 01/23/20 1414 16     Temp 01/23/20 1414 97.9 F (36.6 C)     Temp Source 01/23/20 1414 Oral     SpO2 01/23/20 1414 99 %     Weight 01/23/20 1415 156 lb (70.8 kg)     Height 01/23/20 1415 5\' 6"  (1.676 m)     Head Circumference --      Peak Flow --      Pain Score 01/23/20 1414 8   Constitutional: Alert and oriented.  Eyes: Conjunctivae are normal.  ENT      Head: Normocephalic and atraumatic.      Nose: No congestion/rhinnorhea.      Mouth/Throat: Mucous membranes are moist.      Neck: No stridor. Hematological/Lymphatic/Immunilogical: No cervical lymphadenopathy. Cardiovascular: Normal rate, regular rhythm.  No murmurs, rubs, or gallops.  Respiratory: Normal respiratory effort without tachypnea nor retractions. Breath sounds are clear and equal bilaterally. No wheezes/rales/rhonchi. Gastrointestinal: Soft and non tender. No rebound.  No guarding.  Genitourinary: Deferred Musculoskeletal: Normal range of motion in all extremities. No lower extremity edema. Neurologic:  Normal speech and language. No gross focal neurologic deficits are appreciated.  Skin:  Skin is warm, dry and intact. No rash noted. Psychiatric: Mood and affect are normal. Speech and behavior are normal. Patient exhibits appropriate insight and judgment.  ____________________________________________    LABS (pertinent positives/negatives)  BMP wnl except glu 130 CBC wbc 6.9, hgb 13.9, plt  274 Trop hs 4 to 5  ____________________________________________   EKG  I, Phineas Semen, attending physician, personally viewed and interpreted this EKG  EKG Time: 1359 Rate: 95 Rhythm: normal sinus rhythm Axis: normal Intervals: qtc narrow QRS: narrow ST changes: no st elevation Impression: normal ekg   ____________________________________________    RADIOLOGY  CXR No active disease  ____________________________________________   PROCEDURES  Procedures  ____________________________________________   INITIAL IMPRESSION / ASSESSMENT AND PLAN / ED COURSE  Pertinent labs & imaging results that were available during my care of the patient were reviewed by me and considered in my medical decision making (see chart for details).   Patient presented to the emergency department today because of concerns for high blood pressure, fast heart rate and tingling.  To my exam patient's blood pressure is elevated.  Her tachycardia had resolved and she still continues to have some tingling.  Blood work x-ray EKG without concerning abnormalities.  I discussed this with the patient.  At this point I do wonder if some of her tingling could be related to her high blood pressure since it seemed to be related when it occurred previously.  Discussed with Dr. Gwen Pounds with cardiology who recommended adding amlodipine 5 mg daily.  Discussed this with the patient.  Encourage patient to have close follow-up with cardiology.  ____________________________________________   FINAL CLINICAL IMPRESSION(S) / ED DIAGNOSES  Final diagnoses:  Hypertension, unspecified type     Note: This dictation was prepared with Dragon dictation. Any transcriptional errors that result from this process are unintentional     Phineas Semen, MD 01/23/20 2320

## 2020-01-23 NOTE — Discharge Instructions (Signed)
Please seek medical attention for any high fevers, chest pain, shortness of breath, change in behavior, persistent vomiting, bloody stool or any other new or concerning symptoms.  

## 2020-01-23 NOTE — ED Notes (Signed)
ED Provider Goodman at bedside. 

## 2020-01-23 NOTE — ED Triage Notes (Signed)
Patient reports feeling her heart racing and measured her pulse at home and it was 120. States this has happened in the past. States she feels mild tightness around her chest. Reports she sees Dr. Gwen Pounds. Patient also states she has been having intermittent tingling in arms and legs since yesterday. Equal strength and sensation noted.

## 2020-02-13 ENCOUNTER — Other Ambulatory Visit
Admission: RE | Admit: 2020-02-13 | Discharge: 2020-02-13 | Disposition: A | Discharge status: Home / Self Care | Payer: 59 | Source: Ambulatory Visit | Attending: Family Medicine | Admitting: Family Medicine

## 2020-02-13 DIAGNOSIS — R1013 Epigastric pain: Secondary | ICD-10-CM | POA: Diagnosis not present

## 2020-02-13 LAB — FIBRIN DERIVATIVES D-DIMER (ARMC ONLY): Fibrin derivatives D-dimer (ARMC): 937.01 ng/mL (FEU) — ABNORMAL HIGH (ref 0.00–499.00)

## 2020-02-14 ENCOUNTER — Other Ambulatory Visit: Payer: Self-pay | Admitting: Family Medicine

## 2020-02-14 ENCOUNTER — Other Ambulatory Visit: Payer: Self-pay

## 2020-02-14 ENCOUNTER — Ambulatory Visit
Admission: RE | Admit: 2020-02-14 | Discharge: 2020-02-14 | Disposition: A | Discharge status: Home / Self Care | Payer: 59 | Source: Ambulatory Visit | Attending: Family Medicine | Admitting: Family Medicine

## 2020-02-14 DIAGNOSIS — R7989 Other specified abnormal findings of blood chemistry: Secondary | ICD-10-CM

## 2020-02-14 MED ORDER — IOHEXOL 350 MG/ML SOLN
75.0000 mL | Freq: Once | INTRAVENOUS | Status: AC | PRN
  Administered 2020-02-14: 75 mL via INTRAVENOUS

## 2020-02-21 ENCOUNTER — Other Ambulatory Visit: Payer: Self-pay | Admitting: Family Medicine

## 2020-02-21 DIAGNOSIS — R110 Nausea: Secondary | ICD-10-CM

## 2020-02-21 DIAGNOSIS — R1013 Epigastric pain: Secondary | ICD-10-CM

## 2020-02-21 DIAGNOSIS — R630 Anorexia: Secondary | ICD-10-CM

## 2020-02-28 ENCOUNTER — Other Ambulatory Visit: Payer: Self-pay

## 2020-02-28 ENCOUNTER — Ambulatory Visit
Admission: RE | Admit: 2020-02-28 | Discharge: 2020-02-28 | Disposition: A | Discharge status: Home / Self Care | Payer: 59 | Source: Ambulatory Visit | Attending: Family Medicine | Admitting: Family Medicine

## 2020-02-28 DIAGNOSIS — R110 Nausea: Secondary | ICD-10-CM | POA: Insufficient documentation

## 2020-02-28 DIAGNOSIS — R1013 Epigastric pain: Secondary | ICD-10-CM | POA: Insufficient documentation

## 2020-02-28 DIAGNOSIS — R630 Anorexia: Secondary | ICD-10-CM | POA: Diagnosis present

## 2020-04-09 ENCOUNTER — Other Ambulatory Visit: Payer: Self-pay | Admitting: Internal Medicine

## 2020-04-09 DIAGNOSIS — Z1231 Encounter for screening mammogram for malignant neoplasm of breast: Secondary | ICD-10-CM

## 2020-05-15 ENCOUNTER — Other Ambulatory Visit: Payer: Self-pay

## 2020-05-15 ENCOUNTER — Telehealth (INDEPENDENT_AMBULATORY_CARE_PROVIDER_SITE_OTHER): Payer: Self-pay | Admitting: Gastroenterology

## 2020-05-15 DIAGNOSIS — Z1211 Encounter for screening for malignant neoplasm of colon: Secondary | ICD-10-CM

## 2020-05-15 MED ORDER — NA SULFATE-K SULFATE-MG SULF 17.5-3.13-1.6 GM/177ML PO SOLN: 1.0000 | Freq: Once | ORAL | 0 refills | Status: AC

## 2020-05-15 NOTE — Progress Notes (Signed)
Gastroenterology Pre-Procedure Review  Request Date: Monday 06/02/20 Requesting Physician: Dr. Servando Snare  PATIENT REVIEW QUESTIONS: The patient responded to the following health history questions as indicated:    1. Are you having any GI issues? no 2. Do you have a personal history of Polyps? no 3. Do you have a family history of Colon Cancer or Polyps? no 4. Diabetes Mellitus? no 5. Joint replacements in the past 12 months?no 6. Major health problems in the past 3 months?no 7. Any artificial heart valves, MVP, or defibrillator?no    MEDICATIONS & ALLERGIES:    Patient reports the following regarding taking any anticoagulation/antiplatelet therapy:   Plavix, Coumadin, Eliquis, Xarelto, Lovenox, Pradaxa, Brilinta, or Effient? no Aspirin? no  Patient confirms/reports the following medications:  Current Outpatient Medications  Medication Sig Dispense Refill  . telmisartan (MICARDIS) 80 MG tablet Take by mouth.    . hydrOXYzine (ATARAX/VISTARIL) 25 MG tablet Take 1 tablet (25 mg total) by mouth every 8 (eight) hours as needed. (Patient not taking: Reported on 05/15/2020) 30 tablet 0  . omeprazole (PRILOSEC) 40 MG capsule Take 40 mg by mouth daily. (Patient not taking: Reported on 05/15/2020)    . triamcinolone cream (KENALOG) 0.1 % SMARTSIG:1 Application Topical 2-3 Times Daily (Patient not taking: Reported on 05/15/2020)     No current facility-administered medications for this visit.    Patient confirms/reports the following allergies:  Allergies  Allergen Reactions  . Codeine Hives  . Sulfa Antibiotics Hives    No orders of the defined types were placed in this encounter.   AUTHORIZATION INFORMATION Primary Insurance: 1D#: Group #:  Secondary Insurance: 1D#: Group #:  SCHEDULE INFORMATION: Date: Monday 06/02/20 Time: Location:[facility]

## 2020-05-21 ENCOUNTER — Telehealth: Payer: Self-pay

## 2020-05-21 NOTE — Telephone Encounter (Signed)
Patient called and let us know that she needs to rescheduled her colonoscopy on 06/02/2020. Patient agreed to move to 07/21/2020. Gave patient new COVID dates. Patient insurance will not changed

## 2020-07-17 ENCOUNTER — Other Ambulatory Visit: Payer: 59

## 2020-07-18 ENCOUNTER — Other Ambulatory Visit: Payer: Self-pay | Admitting: Internal Medicine

## 2020-07-18 DIAGNOSIS — Z1231 Encounter for screening mammogram for malignant neoplasm of breast: Secondary | ICD-10-CM

## 2020-07-21 ENCOUNTER — Ambulatory Visit: Admit: 2020-07-21 | Payer: 59 | Admitting: Gastroenterology

## 2020-07-21 SURGERY — COLONOSCOPY WITH PROPOFOL
Anesthesia: Choice

## 2021-10-12 IMAGING — CT CT ANGIO CHEST
2 of 6 series · 19 of 46 positions shown · IV contrast (APPLIED)
Comparison: Chest x-ray 01/23/2019

CLINICAL DATA: Intermittent chest pain for 3 weeks.

EXAM:
CT ANGIOGRAPHY CHEST WITH CONTRAST
TECHNIQUE: Multidetector CT imaging of the chest was performed using the
standard protocol during bolus administration of intravenous
contrast. Multiplanar CT image reconstructions and MIPs were
obtained to evaluate the vascular anatomy.
CONTRAST:  75mL OMNIPAQUE IOHEXOL 350 MG/ML SOLN

[Series 5: thins · axial · 0.64mm/px · z∈[+99,+361]mm · 17 of 288 slices shown]
[im 13/288  lung]
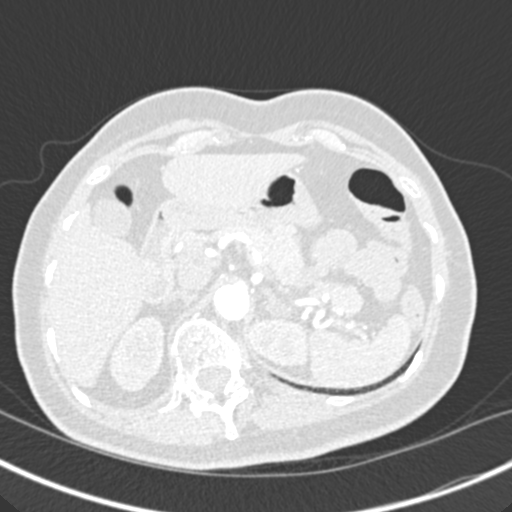
[im 25/288  soft-tissue]
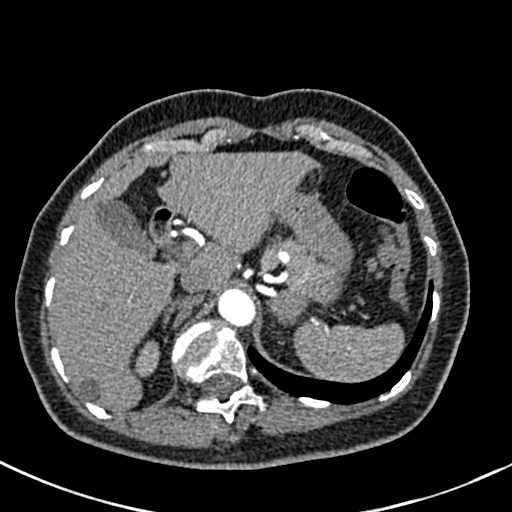
[im 50/288  lung]
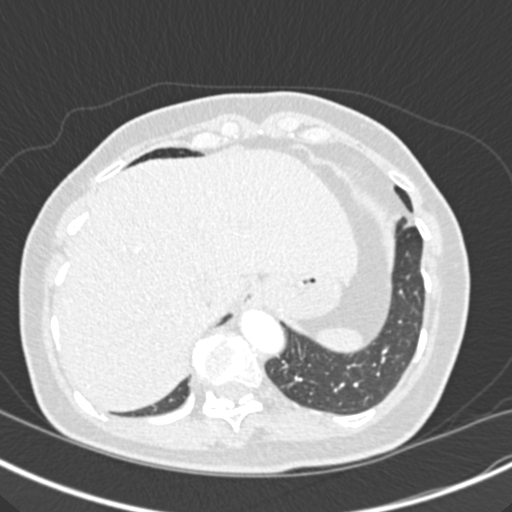
[im 63/288  soft-tissue]
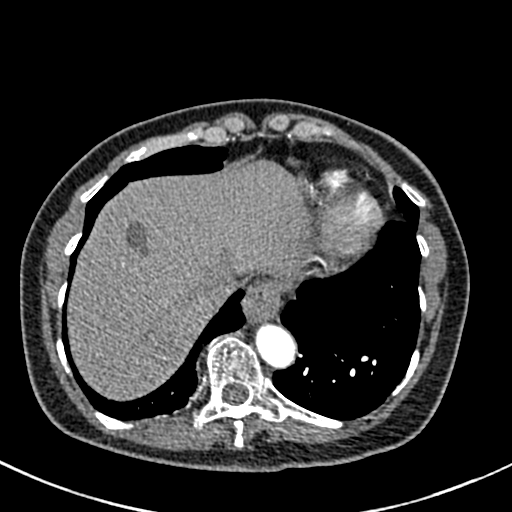
[im 75/288  lung]
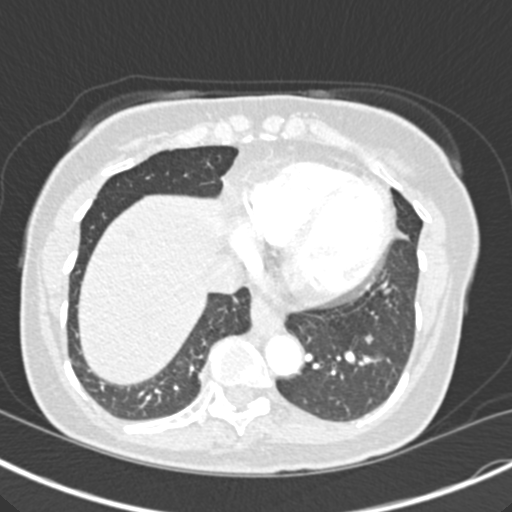
[im 100/288  soft-tissue]
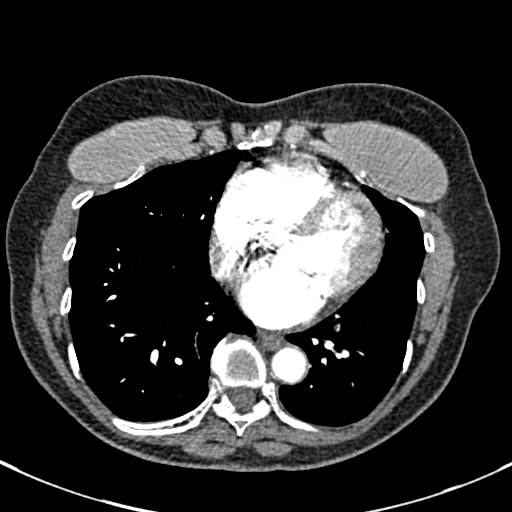
[im 113/288  lung]
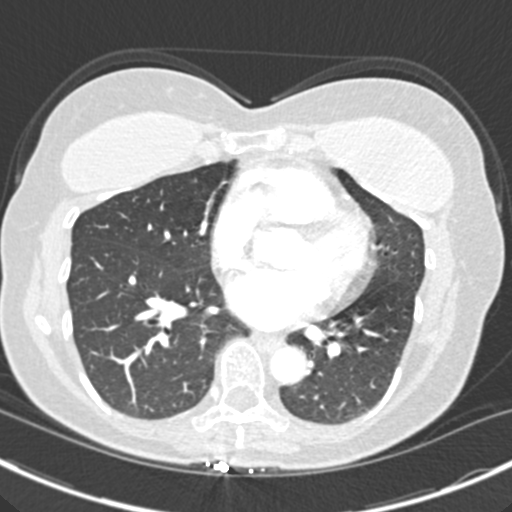
[im 125/288  soft-tissue]
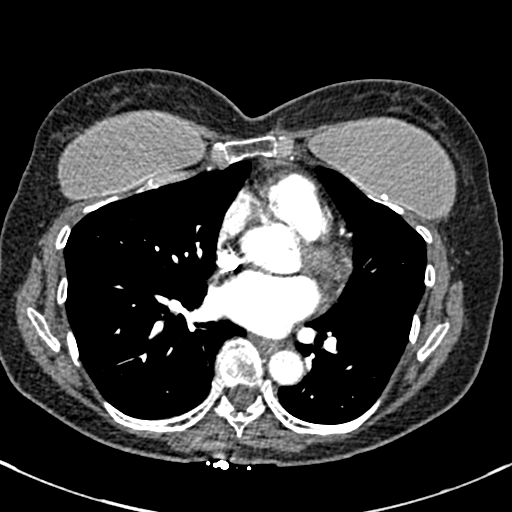
[im 150/288  lung]
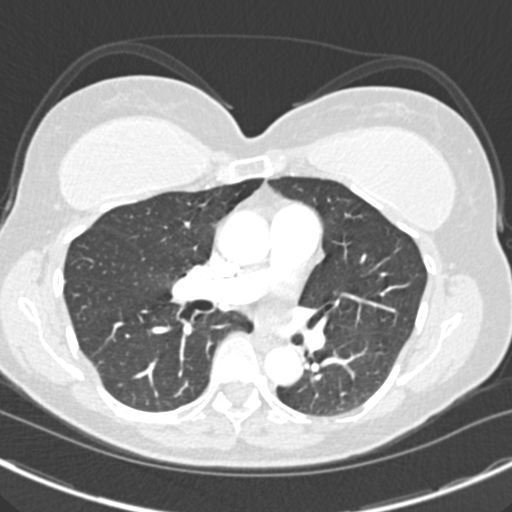
[im 163/288  soft-tissue]
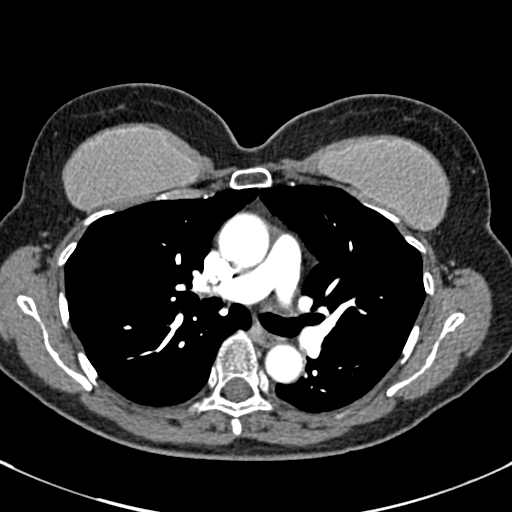
[im 175/288  lung]
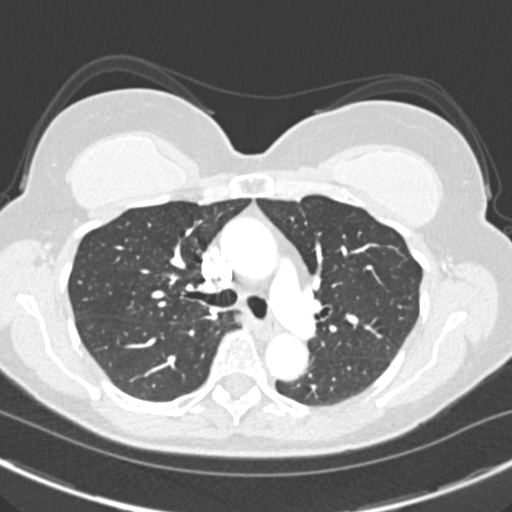
[im 188/288  soft-tissue]
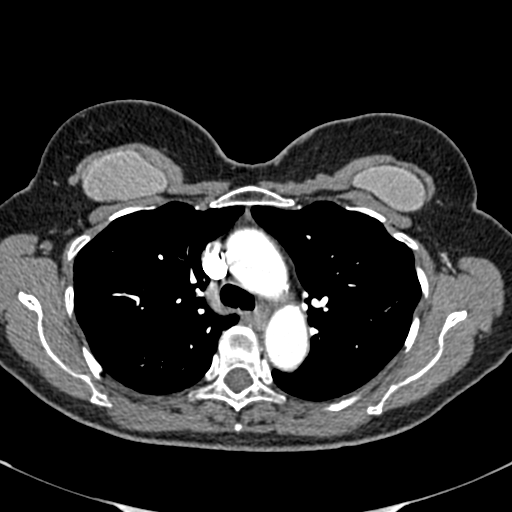
[im 213/288  lung]
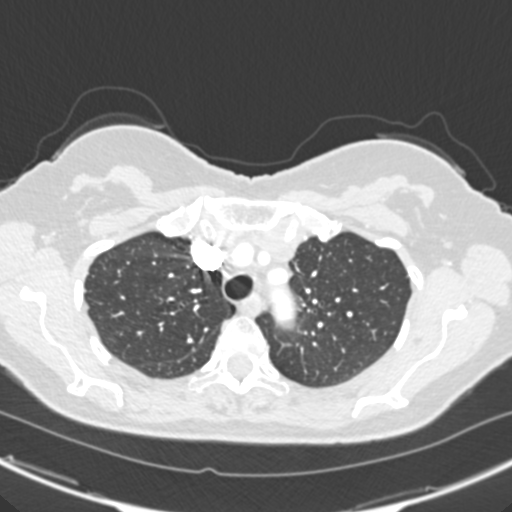
[im 225/288  soft-tissue]
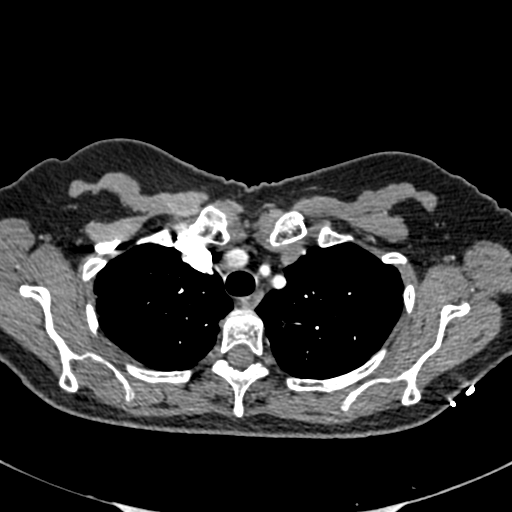
[im 238/288  lung]
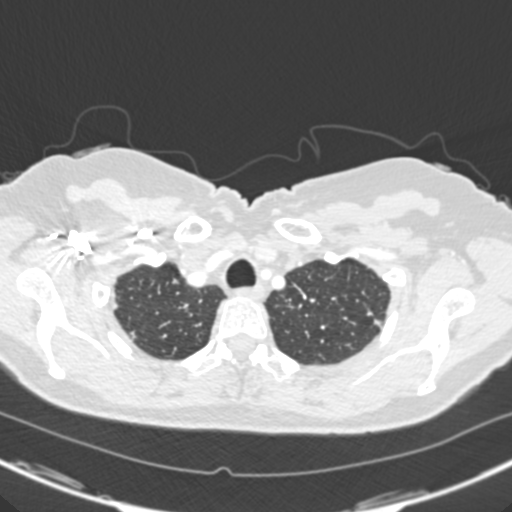
[im 263/288  soft-tissue]
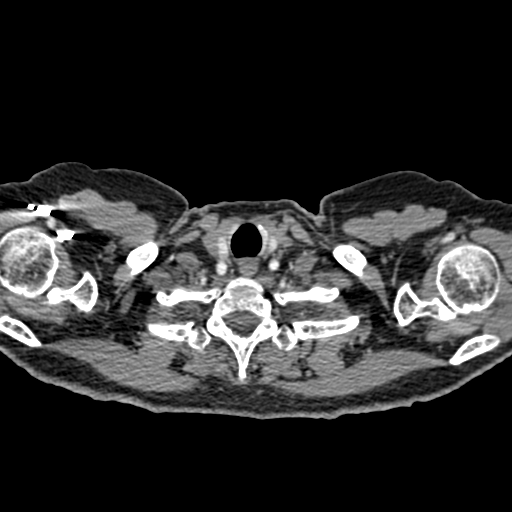
[im 275/288  lung]
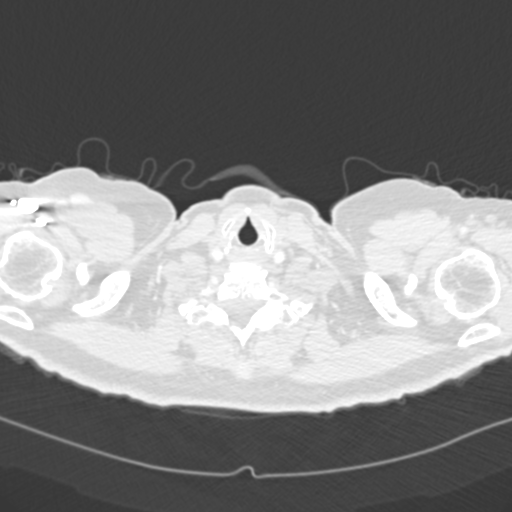

[Series 7: coronal mpr · coronal · 0.58mm/px · 2 of 73 slices shown]
[im 25/73  soft-tissue]
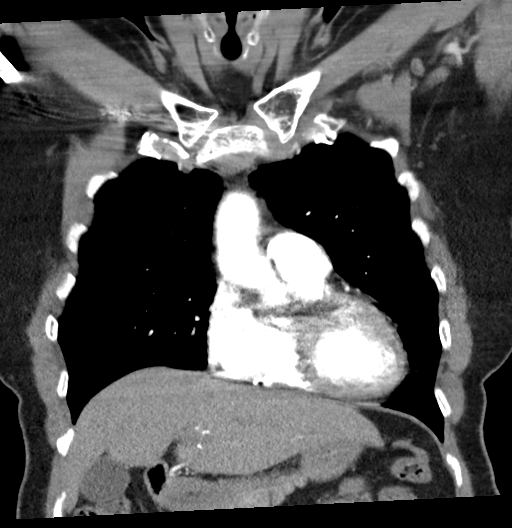
[im 49/73  soft-tissue]
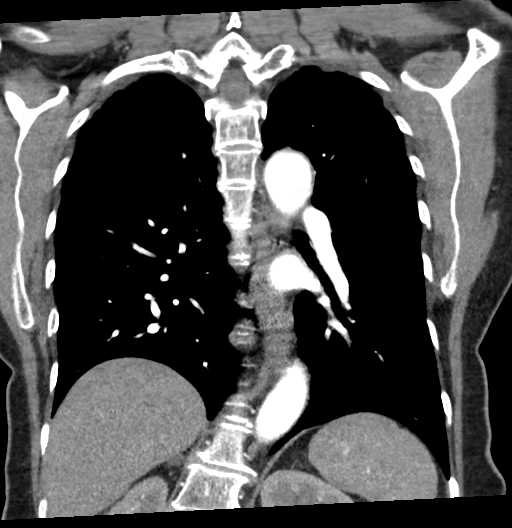

[19 of 46 positions shown; findings below may reference images not displayed]

FINDINGS: Cardiovascular: The heart is normal in size. No pericardial
effusion. The aorta is normal in caliber. No dissection. No
atherosclerotic calcifications. The branch vessels are patent. No
definite coronary artery calcifications.

The pulmonary arterial tree is well opacified. No filling defects to
suggest pulmonary embolism.

Mediastinum/Nodes: No mediastinal or hilar mass or adenopathy. The
esophagus is grossly normal. There is a small hiatal hernia noted.

Lungs/Pleura: No acute pulmonary findings. No worrisome pulmonary
lesions. No interstitial lung disease or bronchiectasis.

Upper Abdomen: Scattered benign-appearing hepatic cysts. No
worrisome hepatic lesions are identified. Gallbladder is normal. No
adrenal gland lesions. The upper abdominal aorta is unremarkable.

Musculoskeletal: No breast masses are identified. Bilateral breast
prostheses are noted. No supraclavicular or axillary adenopathy.

Moderate scoliosis. No significant bone findings.

Review of the MIP images confirms the above findings.
IMPRESSION: 1. No CT findings for pulmonary embolism.
2. Normal thoracic aorta.
3. No acute pulmonary findings or worrisome pulmonary lesions.
4. Small hiatal hernia.
5. Scattered benign-appearing hepatic cysts.

## 2021-10-26 IMAGING — US US ABDOMEN COMPLETE
1 series · 14 of 25 positions shown · non-contrast
Comparison: Ultrasound 01/30/2019.

CLINICAL DATA: Epigastric pain and nausea.  Decreased appetite

EXAM:
ABDOMEN ULTRASOUND COMPLETE

[Series 1: us abdomen complete · 0.17mm/px · 14 of 77 slices shown]
[im 1/77]
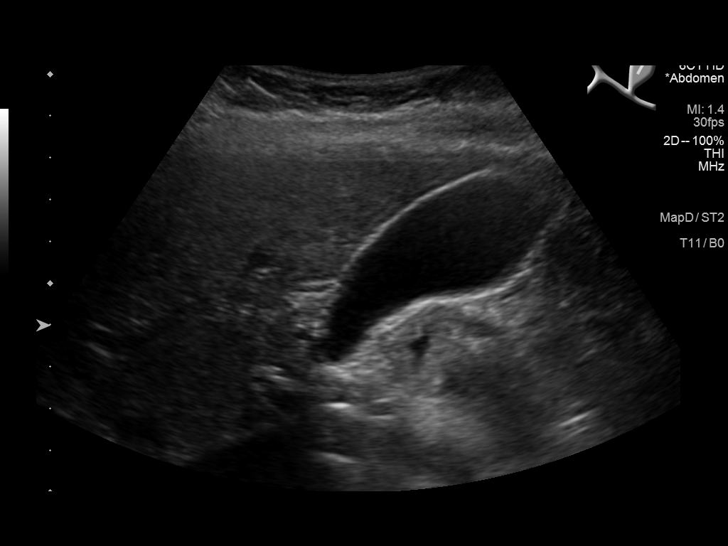
[im 7/77]
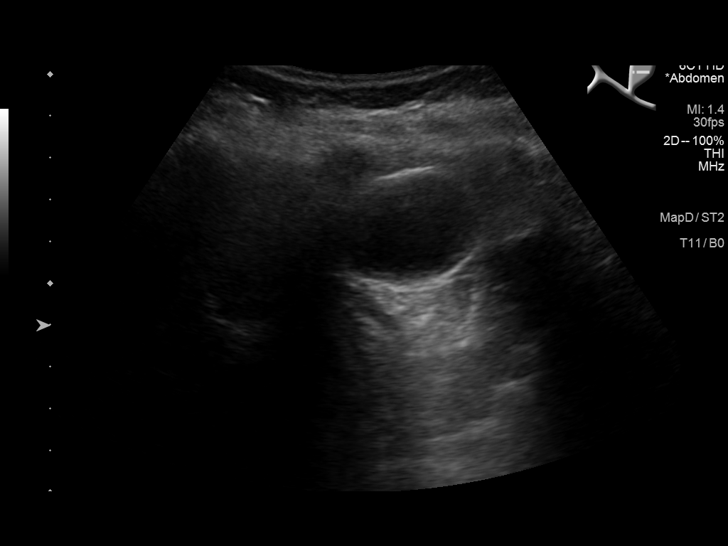
[im 13/77]
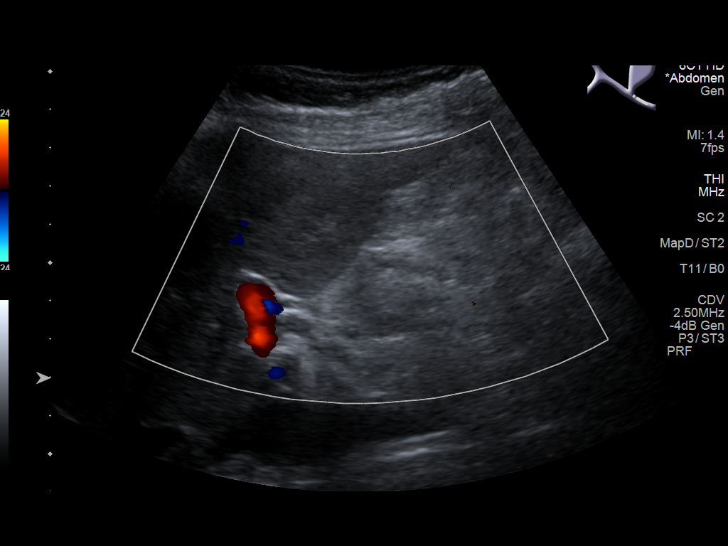
[im 20/77]
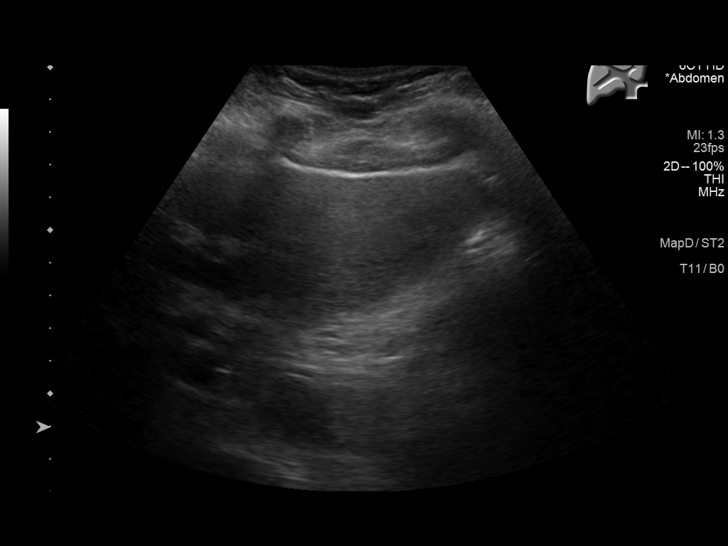
[im 26/77]
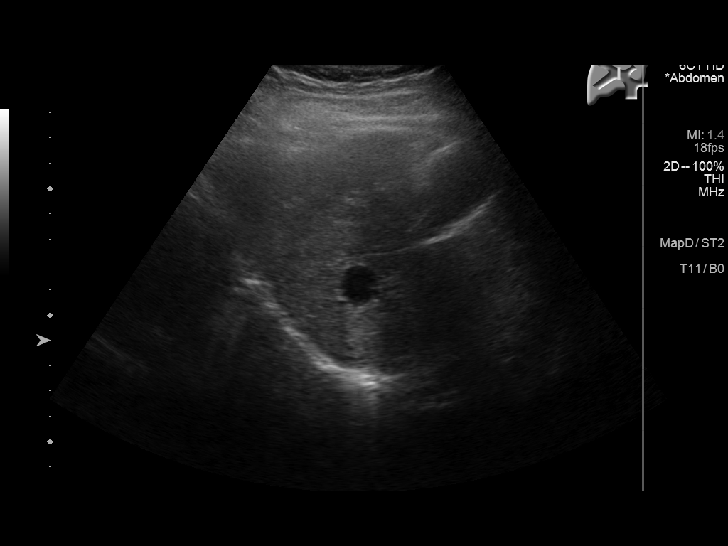
[im 29/77]
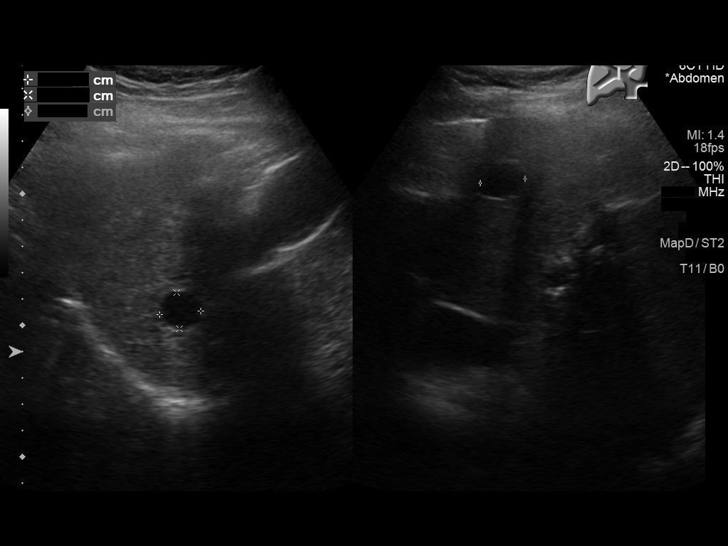
[im 35/77]
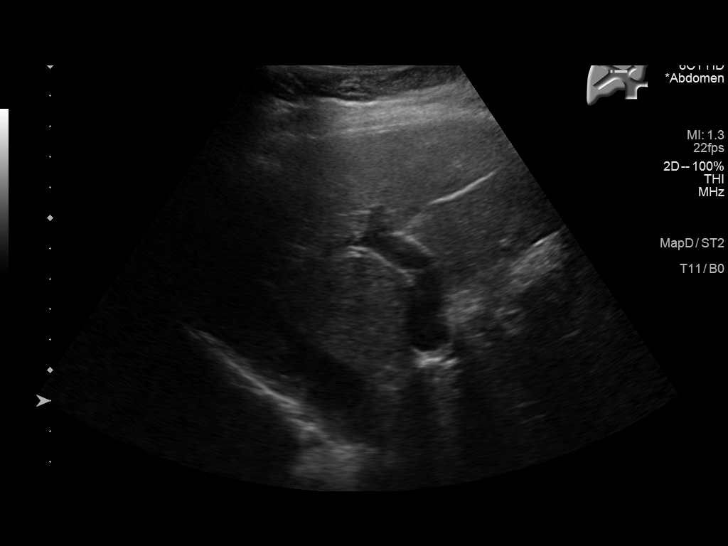
[im 42/77]
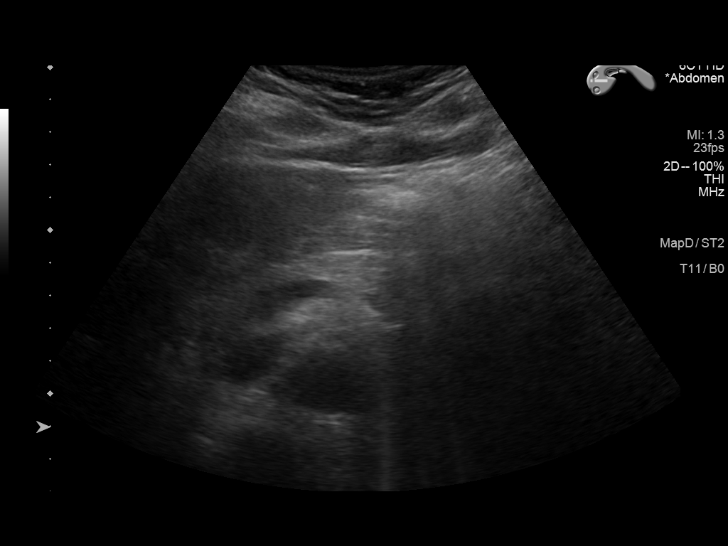
[im 48/77]
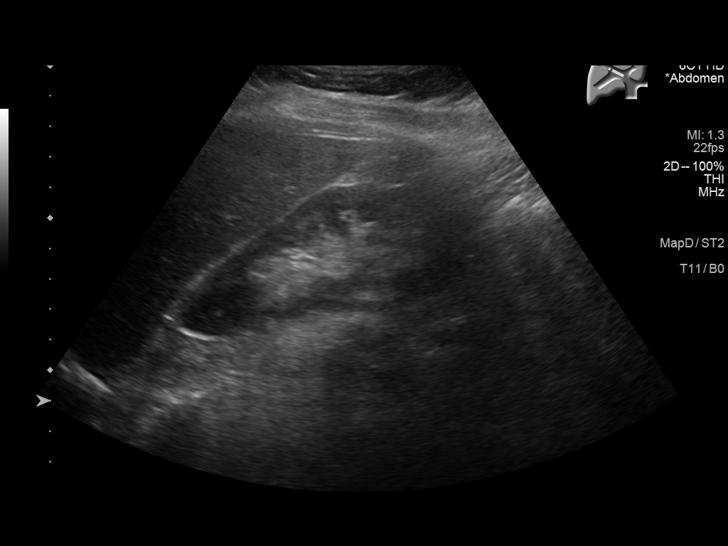
[im 51/77]
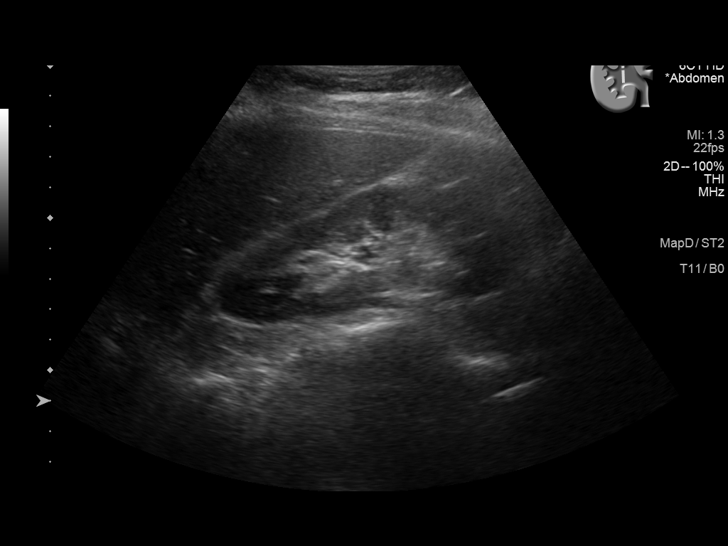
[im 58/77]
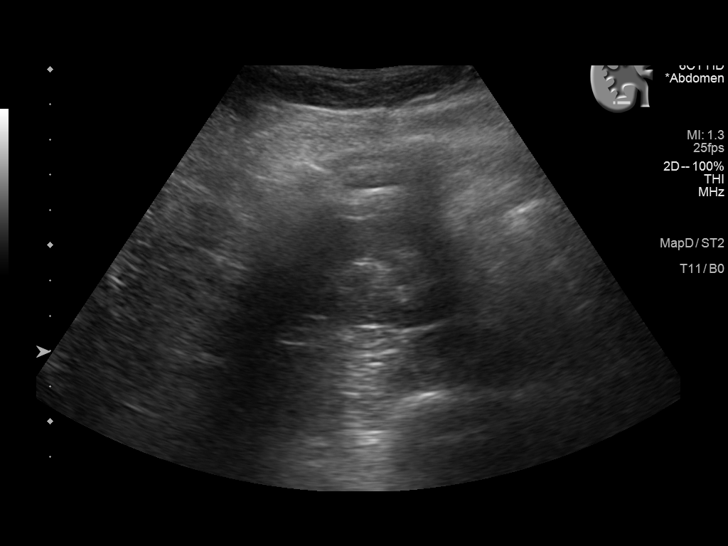
[im 64/77]
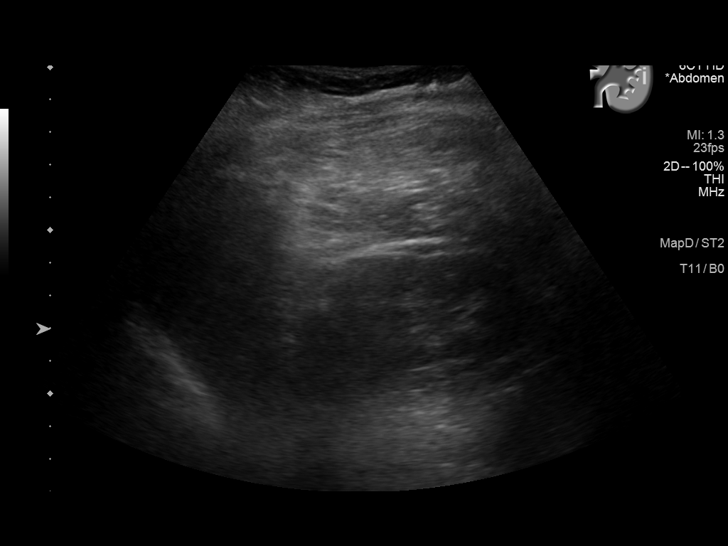
[im 70/77]
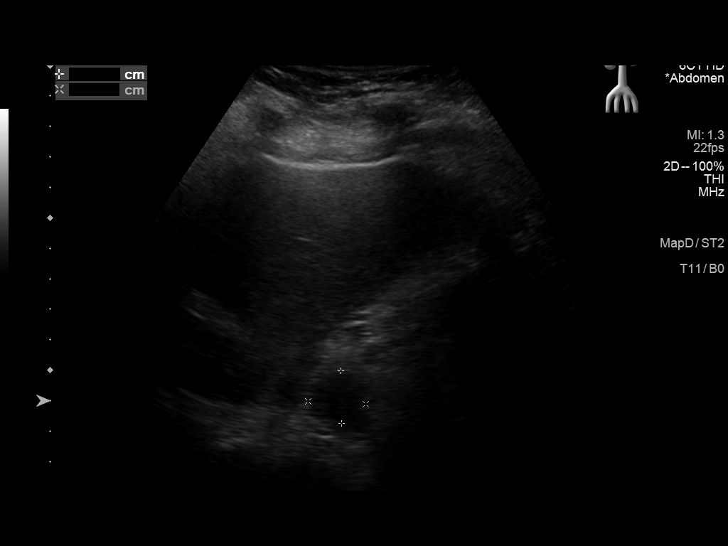
[im 77/77]
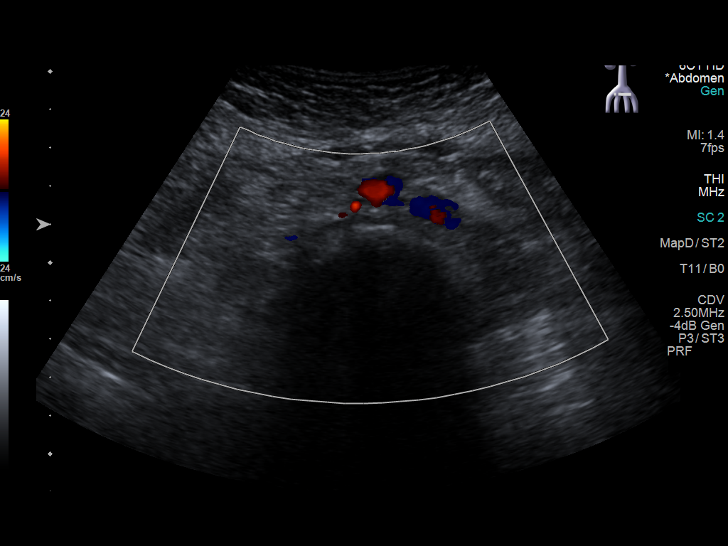

[14 of 25 positions shown; findings below may reference images not displayed]

FINDINGS: Gallbladder: No gallstones or wall thickening visualized. No
sonographic Murphy sign noted by sonographer.

Common bile duct: Diameter: Upper limits of normal at 6 mm.

Liver: Again demonstrates simple cyst in the central liver. Portal
vein is patent on color Doppler imaging with normal direction of
blood flow towards the liver.

IVC: No abnormality visualized.

Pancreas: Visualized portion unremarkable.

Spleen: Size and appearance within normal limits.

Right Kidney: Length: 10.3 cm. Echogenicity within normal limits. No
mass or hydronephrosis visualized.

Left Kidney: Length: 8.8 cm. Echogenicity within normal limits. No
mass or hydronephrosis visualized.

Abdominal aorta: No aneurysm visualized.

Other findings: No ascites
IMPRESSION: 1. No acute findings abdominal ultrasound.
2. Normal gallbladder and biliary tree.

## 2022-01-22 ENCOUNTER — Other Ambulatory Visit: Payer: Self-pay | Admitting: Internal Medicine

## 2022-01-22 DIAGNOSIS — Z1231 Encounter for screening mammogram for malignant neoplasm of breast: Secondary | ICD-10-CM

## 2022-07-27 ENCOUNTER — Other Ambulatory Visit: Payer: Self-pay | Admitting: Internal Medicine

## 2022-07-27 DIAGNOSIS — Z1231 Encounter for screening mammogram for malignant neoplasm of breast: Secondary | ICD-10-CM

## 2022-08-26 ENCOUNTER — Other Ambulatory Visit: Payer: Self-pay | Admitting: Internal Medicine

## 2022-08-26 DIAGNOSIS — R0789 Other chest pain: Secondary | ICD-10-CM

## 2022-08-26 DIAGNOSIS — I2089 Other forms of angina pectoris: Secondary | ICD-10-CM

## 2022-09-21 ENCOUNTER — Telehealth (HOSPITAL_COMMUNITY): Payer: Self-pay | Admitting: Emergency Medicine

## 2022-09-21 DIAGNOSIS — R079 Chest pain, unspecified: Secondary | ICD-10-CM

## 2022-09-21 MED ORDER — METOPROLOL TARTRATE 100 MG PO TABS: 100.0000 mg | ORAL_TABLET | Freq: Once | ORAL | 0 refills | Status: AC

## 2022-09-21 NOTE — Telephone Encounter (Signed)
Reaching out to patient to offer assistance regarding upcoming cardiac imaging study; pt verbalizes understanding of appt date/time, parking situation and where to check in, pre-test NPO status and medications ordered, and verified current allergies; name and call back number provided for further questions should they arise Marchia Bond RN Navigator Cardiac Imaging [facility] [phone] office [phone] cell  Arrival [address] [facility]  100mg  metoprolol tartrate Denies iv issues [email or web address]

## 2022-09-23 ENCOUNTER — Ambulatory Visit
Admission: RE | Admit: 2022-09-23 | Discharge: 2022-09-23 | Disposition: A | Discharge status: Home / Self Care | Payer: Medicare Other | Source: Ambulatory Visit | Attending: Internal Medicine | Admitting: Internal Medicine

## 2022-09-23 DIAGNOSIS — R0789 Other chest pain: Secondary | ICD-10-CM | POA: Diagnosis present

## 2022-09-23 DIAGNOSIS — I2089 Other forms of angina pectoris: Secondary | ICD-10-CM

## 2022-09-23 HISTORY — DX: Malignant (primary) neoplasm, unspecified: C80.1

## 2022-09-23 MED ORDER — IOHEXOL 350 MG/ML SOLN
75.0000 mL | Freq: Once | INTRAVENOUS | Status: AC | PRN
  Administered 2022-09-23: 75 mL via INTRAVENOUS

## 2022-09-23 MED ORDER — NITROGLYCERIN 0.4 MG SL SUBL
0.8000 mg | SUBLINGUAL_TABLET | Freq: Once | SUBLINGUAL | Status: AC
  Administered 2022-09-23: 0.8 mg via SUBLINGUAL

## 2022-09-23 NOTE — Progress Notes (Signed)
Patient tolerated CT well. Drank water after. Vital signs stable encourage to drink water throughout day.Reasons explained and verbalized understanding. Ambulated steady gait.  

## 2022-12-13 ENCOUNTER — Inpatient Hospital Stay: Admission: RE | Admit: 2022-12-13 | Payer: Medicare Other | Source: Ambulatory Visit

## 2022-12-27 ENCOUNTER — Ambulatory Visit
Admission: RE | Admit: 2022-12-27 | Discharge: 2022-12-27 | Disposition: A | Discharge status: Home / Self Care | Payer: Medicare Other | Source: Ambulatory Visit | Attending: Internal Medicine | Admitting: Internal Medicine

## 2022-12-27 ENCOUNTER — Other Ambulatory Visit: Payer: Self-pay | Admitting: Internal Medicine

## 2022-12-27 DIAGNOSIS — Z1231 Encounter for screening mammogram for malignant neoplasm of breast: Secondary | ICD-10-CM | POA: Insufficient documentation

## 2023-07-25 ENCOUNTER — Other Ambulatory Visit: Payer: Self-pay | Admitting: Internal Medicine

## 2023-07-25 DIAGNOSIS — Z1231 Encounter for screening mammogram for malignant neoplasm of breast: Secondary | ICD-10-CM
